# Patient Record
Sex: Female | Born: 1985
Health system: Southern US, Community
[De-identification: ages and names within clinical notes are randomized; demographics above are authoritative.]

## PROBLEM LIST (undated history)

## (undated) ENCOUNTER — Inpatient Hospital Stay (HOSPITAL_COMMUNITY): Payer: Self-pay

## (undated) DIAGNOSIS — S3992XA Unspecified injury of lower back, initial encounter: Secondary | ICD-10-CM

## (undated) DIAGNOSIS — I10 Essential (primary) hypertension: Secondary | ICD-10-CM

## (undated) DIAGNOSIS — F419 Anxiety disorder, unspecified: Secondary | ICD-10-CM

## (undated) HISTORY — DX: Essential (primary) hypertension: I10

## (undated) HISTORY — DX: Anxiety disorder, unspecified: F41.9

## (undated) HISTORY — PX: APPENDECTOMY: SHX54

---

## 2004-05-03 ENCOUNTER — Encounter: Admission: RE | Admit: 2004-05-03 | Discharge: 2004-05-03 | Payer: Self-pay | Admitting: Family Medicine

## 2006-12-17 ENCOUNTER — Inpatient Hospital Stay (HOSPITAL_COMMUNITY): Admission: AD | Admit: 2006-12-17 | Discharge: 2006-12-17 | Payer: Self-pay | Admitting: Obstetrics and Gynecology

## 2007-07-21 ENCOUNTER — Inpatient Hospital Stay (HOSPITAL_COMMUNITY): Admission: AD | Admit: 2007-07-21 | Discharge: 2007-07-21 | Payer: Self-pay | Admitting: Obstetrics and Gynecology

## 2007-07-28 ENCOUNTER — Inpatient Hospital Stay (HOSPITAL_COMMUNITY): Admission: AD | Admit: 2007-07-28 | Discharge: 2007-07-31 | Payer: Self-pay | Admitting: Obstetrics and Gynecology

## 2007-07-29 ENCOUNTER — Encounter (INDEPENDENT_AMBULATORY_CARE_PROVIDER_SITE_OTHER): Payer: Self-pay | Admitting: Obstetrics and Gynecology

## 2009-09-26 ENCOUNTER — Emergency Department (HOSPITAL_COMMUNITY): Admission: EM | Admit: 2009-09-26 | Discharge: 2009-09-26 | Payer: Self-pay | Admitting: Emergency Medicine

## 2011-03-06 NOTE — Discharge Summary (Signed)
Ashley [name], Ashley [name]             ACCOUNT NO.:  [id number]   MEDICAL RECORD NO.:  [id number]          PATIENT TYPE:  INP   LOCATION:  9118                          FACILITY:  [facility]   PHYSICIAN:  Malachi Pro. Ambrose Mantle, M.D. DATE OF BIRTH:  1986/05/07   DATE OF ADMISSION:  07/28/2007  DATE OF DISCHARGE:  07/31/2007                               DISCHARGE SUMMARY   A 25 year old white female para 0, gravida 1, EDC August 05, 2007  presented for induction of labor due to increased blood pressure.  Blood  group and type O+, negative antibody, RPR nonreactive, rubella immune,  hepatitis B surface antigen negative, HIV negative, GC and chlamydia  negative, 1-hour Glucola 114.  Group B strep positive. prenatal care  associated with occasional palpitations with recent increased blood  pressure with normal labs, normal  24-hour urine protein.  Blood  pressure on admission in the office was 140-150 over 100-110.   PAST MEDICAL HISTORY:  Negative.   SURGICAL HISTORY:  Negative.   ALLERGIES:  Questionable to OMNIPEN.   MEDICATIONS:  None.   PHYSICAL EXAMINATION:  VITAL SIGNS:  On admission revealed normal vital  signs except for heart rate 125, blood pressure 130/80, heart normal  size and sounds.  There was tachycardia.  LUNGS:  Clear to auscultation.  ABDOMEN:  Gravid.  Estimated fetal weight 7 pounds.  Fetal heart tones  normal.  Cervix 1 cm, 40% vertex at a -3.   Because of questionable PENICILLIN allergy the patient was begun on  Ancef for the positive group B strep.  After overnight use of Pitocin at  8:20 a.m. on July 29, 2007, the cervix was 3-4 cm dilated, vertex was  present, and cervix was 80% effaced.  The patient received an epidural.  By 1:19 p.m. the cervix was 9 cm, 100% vertex at a 0 station.  She  progressed to full dilatation and pushed with two contractions, and the  vertex came to the perineum.  With two more pushes the vertex rotated  ROT ROA and without pushing, the  baby delivered over a second-degree  midline laceration by Dr. Ambrose Mantle.  The infant was female, 7 pounds 10  ounces, Apgars of 9 at 1 and 9 at 5 minutes.  Placenta was intact.  A  few membranes were removed with ring forceps.  Second-degree midline  laceration repaired with 3-0 Vicryl.  Blood loss about 400 cc.  Postpartum the patient did well and was discharged on the second  postpartum day.  Her blood pressure never rose to any significant level.   LABORATORY DATA:  Showed initial white count 9200, hemoglobin 13.2,  hematocrit 38.1, platelet count 223,000.  Follow-up hemoglobins were  13.5 prior to delivery and 10.6 postpartum.  PIH labs showed normal  liver function tests.  SGOT was 21.  PT 24.  LDH 193.  Uric acid 4.5.   FINAL DIAGNOSES:  1. Intrauterine pregnancy 38+ weeks.  2. Pregnancy-induced hypertension.   OPERATIONS:  Spontaneous delivery ROA, repair of second degree midline  laceration.   FINAL CONDITION:  Improved.   INSTRUCTIONS:  Regular discharge instruction booklet.  Percocet 5/325 20  tablets one every 4-6 hours as needed for pain.  The patient is advised  to return to the office in six weeks for follow-up examination.      Malachi Pro. Ambrose Mantle, M.D.  Electronically Signed     TFH/MEDQ  D:  07/31/2007  T:  07/31/2007  Job:  [id number]

## 2011-03-10 ENCOUNTER — Emergency Department (HOSPITAL_COMMUNITY): Payer: 59

## 2011-03-10 ENCOUNTER — Other Ambulatory Visit (INDEPENDENT_AMBULATORY_CARE_PROVIDER_SITE_OTHER): Payer: Self-pay | Admitting: Surgery

## 2011-03-10 ENCOUNTER — Inpatient Hospital Stay (HOSPITAL_COMMUNITY)
Admission: EM | Admit: 2011-03-10 | Discharge: 2011-03-10 | DRG: 343 | Disposition: A | Discharge status: Home Health | Payer: 59 | Attending: General Surgery | Admitting: General Surgery

## 2011-03-10 DIAGNOSIS — K3580 Unspecified acute appendicitis: Principal | ICD-10-CM | POA: Diagnosis present

## 2011-03-10 LAB — COMPREHENSIVE METABOLIC PANEL
ALT: 33 U/L (ref 0–35)
AST: 18 U/L (ref 0–37)
Albumin: 3.9 g/dL (ref 3.5–5.2)
Chloride: 101 mEq/L (ref 96–112)
Glucose, Bld: 105 mg/dL — ABNORMAL HIGH (ref 70–99)
Potassium: 3.2 mEq/L — ABNORMAL LOW (ref 3.5–5.1)
Sodium: 135 mEq/L (ref 135–145)
Total Bilirubin: 0.4 mg/dL (ref 0.3–1.2)

## 2011-03-10 LAB — CBC
HCT: 40.5 % (ref 36.0–46.0)
Hemoglobin: 14.2 g/dL (ref 12.0–15.0)
Hemoglobin: 12.5 g/dL (ref 12.0–15.0)
MCH: 31.3 pg (ref 26.0–34.0)
MCHC: 35.2 g/dL (ref 30.0–36.0)
MCV: 89.2 fL (ref 78.0–100.0)
MCV: 89.4 fL (ref 78.0–100.0)
RDW: 11.9 % (ref 11.5–15.5)

## 2011-03-10 LAB — URINALYSIS, ROUTINE W REFLEX MICROSCOPIC
Glucose, UA: NEGATIVE mg/dL
Ketones, ur: >80 mg/dL — AB
pH: 6 (ref 5.0–8.0)

## 2011-03-10 LAB — DIFFERENTIAL
Lymphocytes Relative: 12 % (ref 12–46)
Lymphs Abs: 1.8 10*3/uL (ref 0.7–4.0)
Monocytes Absolute: 0.9 10*3/uL (ref 0.1–1.0)
Monocytes Relative: 6 % (ref 3–12)
Neutro Abs: 12 10*3/uL — ABNORMAL HIGH (ref 1.7–7.7)

## 2011-03-10 LAB — WET PREP, GENITAL
Trich, Wet Prep: NONE SEEN
Yeast Wet Prep HPF POC: NONE SEEN

## 2011-03-10 LAB — GC/CHLAMYDIA PROBE AMP, GENITAL
Chlamydia, DNA Probe: NEGATIVE
GC Probe Amp, Genital: NEGATIVE

## 2011-03-10 MED ORDER — IOHEXOL 300 MG/ML  SOLN
125.0000 mL | Freq: Once | INTRAMUSCULAR | Status: AC | PRN
  Administered 2011-03-10: 125 mL via INTRAVENOUS

## 2011-04-04 NOTE — Op Note (Signed)
  NAMEMARSI, TURVEY             ACCOUNT NO.:  [id number]  MEDICAL RECORD NO.:  [id number]           PATIENT TYPE:  E  LOCATION:  WLED                         FACILITY:  [facility]  PHYSICIAN:  Thornton Park. 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  PROCEDURE:  Laparoscopic appendectomy.  FINDINGS:  Acute appendicitis and partially extraperitoneal appendix.  DESCRIPTION OF PROCEDURE:  The patient was taken to room 1 on Saturday morning by 5 a.m., given general anesthesia.  Preoperatively she received 1 g of Invanz 1 hour preop and the abdomen was prepped with PCMX and draped sterilely.  The abdomen was accessed via umbilicus under Hassan technique.  I cut down and did this open without difficulty.  The abdomen was inflated to 50 mm and a 5 mm trocar was placed in the right upper quadrant and 5 in the left lower quadrant.  I used 5 mm eventually an angled 30-degree scope.  By viewing the CT scan, I suspected this was going to be a right extraperitoneal appendix as it seemed to deviate towards the midline and was fairly posterior.  The tip was grasped and elevated and I came along the inferior border and mobilized it back towards its base.  I then went to the mesentery deliberately with a Harmonic scalpel transecting the appendiceal artery.  Where this area was pulsating, I put some clips on this as well, although I never got into the active bleeding there.  The base was isolated, transected with the Endo-GIA with a white cartridge.  There is a little bit of bleeding from the staple line, which I cauterized with some tube burst touches with the harmonic scalpel.  I protected the terminal ileum from everything and everything looked to be in order.  There was some oozing that I noticed  after I removed the appendix and I went back and irrigated again and cauterized.  Again, no bleeding was seen at that point.  Irrigant was withdrawn.  Everything appeared be in order.  The wound sites were injected with 30 mL of 0.5% Marcaine and then the umbilical defect with [facility] wires was closed with 2 interrupted 0 Vicryls.  Wounds were closed with 4-0 Vicryl and Dermabond.  The patient tolerated the procedure well and was taken to the recovery room in satisfactory condition.     Thornton Park Daphine Deutscher, MD     MBM/MEDQ  D:  03/10/2011  T:  03/10/2011  Job:  [id number]  Electronically Signed by Luretha Murphy MD on 04/04/2011 04:30:59 PM

## 2011-04-24 NOTE — Discharge Summary (Signed)
  [name], [name] NO.:  [id number]  MEDICAL RECORD NO.:  [id number]  LOCATION:  1527                         FACILITY:  [facility]  PHYSICIAN:  Thornton Park. Daphine Deutscher, MD  DATE OF BIRTH:  Apr 30, 1986  DATE OF ADMISSION:  03/10/2011 DATE OF DISCHARGE:  03/10/2011                              DISCHARGE SUMMARY   ADMITTING DIAGNOSIS:  Acute appendicitis.  PROCEDURE:  Laparoscopic appendectomy.  COURSE IN THE HOSPITAL:  This patient was admitted and had a laparoscopic appendectomy.  She did very well and was ready for discharge on postop day #1.  Arrangements were made for return visit in the office and return to work.  Condition good.     Thornton Park Daphine Deutscher, MD     MBM/MEDQ  D:  04/04/2011  T:  04/04/2011  Job:  [id number]  Electronically Signed by Luretha Murphy MD on 04/24/2011 10:15:50 AM

## 2011-05-14 ENCOUNTER — Other Ambulatory Visit (INDEPENDENT_AMBULATORY_CARE_PROVIDER_SITE_OTHER): Payer: Self-pay | Admitting: Surgery

## 2011-05-14 ENCOUNTER — Telehealth (INDEPENDENT_AMBULATORY_CARE_PROVIDER_SITE_OTHER): Payer: Self-pay | Admitting: General Surgery

## 2011-05-14 ENCOUNTER — Ambulatory Visit (HOSPITAL_COMMUNITY)
Admission: RE | Admit: 2011-05-14 | Discharge: 2011-05-14 | Disposition: A | Discharge status: Home / Self Care | Payer: 59 | Source: Ambulatory Visit | Attending: Surgery | Admitting: Surgery

## 2011-05-14 DIAGNOSIS — R1011 Right upper quadrant pain: Secondary | ICD-10-CM

## 2011-05-14 NOTE — Progress Notes (Signed)
Ashley [name] called this [phone]) with history of nocturnal postprandial right upper quadrant pain.  She recently had a lap appendectomy by me.  Have ordered ultrasound to look for gallstones.

## 2011-05-17 ENCOUNTER — Other Ambulatory Visit (HOSPITAL_COMMUNITY): Payer: 59

## 2011-08-02 LAB — CBC
HCT: 38.1
HCT: 39.5
HCT: 36.8
Hemoglobin: 10.6 — ABNORMAL LOW
Hemoglobin: 12.9
MCHC: 34.3
MCHC: 35.2
MCV: 91.3
MCV: 93
MCV: 91.5
Platelets: 217
Platelets: 223
RBC: 3.34 — ABNORMAL LOW
RBC: 4.17
RDW: 13
RDW: 13.3
WBC: 9.2

## 2011-08-02 LAB — COMPREHENSIVE METABOLIC PANEL
ALT: 24
Albumin: 2.8 — ABNORMAL LOW
Alkaline Phosphatase: 248 — ABNORMAL HIGH
BUN: 9
CO2: 19
Calcium: 9.1
Creatinine, Ser: 0.68
GFR calc Af Amer: >60
Glucose, Bld: 61 — ABNORMAL LOW
Glucose, Bld: 114 — ABNORMAL HIGH
Potassium: 3.9
Sodium: 135
Total Bilirubin: 0.4
Total Protein: 6
Total Protein: 5.9 — ABNORMAL LOW

## 2011-08-02 LAB — URINALYSIS, ROUTINE W REFLEX MICROSCOPIC
Nitrite: NEGATIVE
Protein, ur: NEGATIVE
Specific Gravity, Urine: >1.03 — ABNORMAL HIGH
Urobilinogen, UA: 0.2

## 2011-08-02 LAB — LACTATE DEHYDROGENASE: LDH: 180

## 2011-08-02 LAB — URIC ACID: Uric Acid, Serum: 4.4

## 2011-08-02 LAB — URINE MICROSCOPIC-ADD ON

## 2012-06-16 ENCOUNTER — Encounter (HOSPITAL_COMMUNITY): Payer: Self-pay | Admitting: *Deleted

## 2012-06-16 ENCOUNTER — Emergency Department (INDEPENDENT_AMBULATORY_CARE_PROVIDER_SITE_OTHER): Payer: 59

## 2012-06-16 ENCOUNTER — Emergency Department (HOSPITAL_COMMUNITY)
Admission: EM | Admit: 2012-06-16 | Discharge: 2012-06-16 | Disposition: A | Discharge status: Home / Self Care | Payer: 59 | Source: Home / Self Care | Attending: Emergency Medicine | Admitting: Emergency Medicine

## 2012-06-16 DIAGNOSIS — S93409A Sprain of unspecified ligament of unspecified ankle, initial encounter: Secondary | ICD-10-CM

## 2012-06-16 MED ORDER — HYDROMORPHONE HCL PF 1 MG/ML IJ SOLN
2.0000 mg | Freq: Once | INTRAMUSCULAR | Status: AC
  Administered 2012-06-16: 2 mg via INTRAMUSCULAR

## 2012-06-16 MED ORDER — ONDANSETRON HCL 4 MG/2ML IJ SOLN
4.0000 mg | Freq: Once | INTRAMUSCULAR | Status: AC
  Administered 2012-06-16: 4 mg via INTRAMUSCULAR

## 2012-06-16 MED ORDER — OXYCODONE-ACETAMINOPHEN 5-325 MG PO TABS: ORAL_TABLET | ORAL | Status: AC

## 2012-06-16 MED ORDER — ONDANSETRON HCL 4 MG/2ML IJ SOLN
INTRAMUSCULAR | Status: AC
  Filled 2012-06-16: qty 2

## 2012-06-16 MED ORDER — HYDROMORPHONE HCL PF 1 MG/ML IJ SOLN
INTRAMUSCULAR | Status: AC
  Filled 2012-06-16: qty 2

## 2012-06-16 NOTE — ED Provider Notes (Signed)
Chief Complaint  Patient presents with  . 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 She was out jogging this morning when she stepped on uneven portion of pavement and prior to her left ankle. She pop and since then she's had swelling and pain over the lateral malleolus and been unable to bear weight. She denies any numbness or tingling.  Review of Systems:  Other than noted above, the patient denies any of the following symptoms: Systemic:  No fevers, chills, sweats, or aches.  No fatigue or tiredness. Musculoskeletal:  No joint pain, arthritis, bursitis, swelling, back pain, or neck pain. Neurological:  No muscular weakness, paresthesias, headache, or trouble with speech or coordination.  No dizziness.   PMFSH:  Past medical history, family history, social history, meds, and allergies were reviewed.  Physical Exam:   Vital signs:  BP 121/83  Pulse 106  Temp 97.4 F (36.3 C) (Oral)  Resp 18  SpO2 100%  LMP 06/02/2012 Gen:  Alert and oriented times 3.  In no distress. Musculoskeletal: There is marketed swelling over the lateral malleolus. No bruising. It's extremely tender to palpation. She has very little range of motion of the ankle. Unable to do talar tilt, but drawer sign is positive. Also the upper leg squeeze test was positive. Otherwise, all joints had a full a ROM with no swelling, bruising or deformity.  No edema, pulses full. Extremities were warm and pink.  Capillary refill was brisk.  Skin:  Clear, warm and dry.  No rash. Neuro:  Alert and oriented times 3.  Muscle strength was normal.  Sensation was intact to light touch.   Radiology:  Dg Ankle Complete Left  06/16/2012  *RADIOLOGY REPORT*  Clinical Data: Rolled left ankle while running, lateral pain and soft tissue swelling, limited range of motion  LEFT ANKLE COMPLETE - 3+ VIEW  Comparison: None  Findings: Lateral soft tissue  swelling. Ankle mortise intact. Osseous mineralization normal. Plantar calcaneal spur. No acute fracture, dislocation or bone destruction.  IMPRESSION: No acute osseous abnormalities.   Original Report Authenticated By: Lollie Marrow, M.D.    I reviewed the images independently and personally and concur with the radiologist's findings.  Course in Urgent Care Center:   For the pain she was given Dilaudid 2 mg IM and Zofran 4 mg IM. She tolerated this well without any immediate side effects and with good control of her pain. She was placed in a short leg stirrup splint and given crutches for ambulation.  Assessment:  The encounter diagnosis was Ankle sprain. This is probably a third-degree sprain. She may also have a tibiofibular ligament sprain as well.  Plan:   1.  The following meds were prescribed:   New Prescriptions   OXYCODONE-ACETAMINOPHEN (PERCOCET) 5-325 MG PER TABLET    1 to 2 tablets every 6 hours as needed for pain.   2.  The patient was instructed in symptomatic care, including rest and activity, elevation, application of ice and compression.  Appropriate handouts were given. 3.  The patient was told to return if becoming worse in any way, if no better in 3 or 4 days, and given some red flag symptoms that would indicate earlier return.   4.  The patient was told to follow up with Dr. Leonides Grills later on this week.   Reuben Likes, MD 06/16/12 [phone]

## 2012-06-16 NOTE — Progress Notes (Signed)
Orthopedic Tech Progress Note Patient Details:  Ashley [name] 1986/06/03 [id number] Short leg stirrup splint applied to Left lower extremity; crutches/instruction given Ortho Devices Type of Ortho Device: Stirrup splint;Short leg splint;Crutches Ortho Device/Splint Location: Applied to Left LE Ortho Device/Splint Interventions: Application   Asia R Thompson 06/16/2012, 2:47 PM

## 2012-06-16 NOTE — ED Notes (Signed)
Pt  Reports  She  Felled  About  1  Hour  Ago     She  inj        He  l  Ankle  And         And  r       Knee     Abrasion to  r knee  And  swelling  Deformity  Of l  Ankle        Pt        Is  Allergic  To  Sulfa       Ice  And    Pillow   Applied              Did     Not  Black out   Does  However     Appear  In  Pain

## 2013-03-22 DIAGNOSIS — S3992XA Unspecified injury of lower back, initial encounter: Secondary | ICD-10-CM

## 2013-03-22 HISTORY — DX: Unspecified injury of lower back, initial encounter: S39.92XA

## 2013-05-08 ENCOUNTER — Other Ambulatory Visit: Payer: Self-pay | Admitting: Occupational Medicine

## 2013-05-08 ENCOUNTER — Ambulatory Visit: Payer: Self-pay

## 2013-05-08 DIAGNOSIS — R52 Pain, unspecified: Secondary | ICD-10-CM

## 2013-05-13 ENCOUNTER — Ambulatory Visit: Payer: PRIVATE HEALTH INSURANCE | Attending: Family Medicine | Admitting: Physical Therapy

## 2013-05-13 ENCOUNTER — Ambulatory Visit: Payer: PRIVATE HEALTH INSURANCE | Admitting: Physical Therapy

## 2013-05-13 DIAGNOSIS — IMO0001 Reserved for inherently not codable concepts without codable children: Secondary | ICD-10-CM | POA: Insufficient documentation

## 2013-05-13 DIAGNOSIS — M545 Low back pain, unspecified: Secondary | ICD-10-CM | POA: Insufficient documentation

## 2013-05-26 ENCOUNTER — Ambulatory Visit: Payer: PRIVATE HEALTH INSURANCE | Attending: Surgery | Admitting: Physical Therapy

## 2013-05-26 DIAGNOSIS — IMO0001 Reserved for inherently not codable concepts without codable children: Secondary | ICD-10-CM | POA: Insufficient documentation

## 2013-05-26 DIAGNOSIS — M545 Low back pain, unspecified: Secondary | ICD-10-CM | POA: Insufficient documentation

## 2013-05-28 ENCOUNTER — Ambulatory Visit: Payer: PRIVATE HEALTH INSURANCE | Admitting: Physical Therapy

## 2013-06-01 ENCOUNTER — Ambulatory Visit: Payer: PRIVATE HEALTH INSURANCE | Admitting: Physical Therapy

## 2013-06-02 ENCOUNTER — Other Ambulatory Visit (HOSPITAL_COMMUNITY): Payer: Self-pay | Admitting: Physical Medicine and Rehabilitation

## 2013-06-02 DIAGNOSIS — M79604 Pain in right leg: Secondary | ICD-10-CM

## 2013-06-04 ENCOUNTER — Ambulatory Visit: Payer: Self-pay | Admitting: Physical Therapy

## 2013-06-08 ENCOUNTER — Ambulatory Visit (HOSPITAL_COMMUNITY)
Admission: RE | Admit: 2013-06-08 | Discharge: 2013-06-08 | Disposition: A | Discharge status: Home / Self Care | Payer: PRIVATE HEALTH INSURANCE | Source: Ambulatory Visit | Attending: Physical Medicine and Rehabilitation | Admitting: Physical Medicine and Rehabilitation

## 2013-06-08 ENCOUNTER — Ambulatory Visit (HOSPITAL_COMMUNITY): Payer: Self-pay

## 2013-06-08 DIAGNOSIS — Q7649 Other congenital malformations of spine, not associated with scoliosis: Secondary | ICD-10-CM | POA: Insufficient documentation

## 2013-06-08 DIAGNOSIS — M79604 Pain in right leg: Secondary | ICD-10-CM

## 2013-06-08 DIAGNOSIS — M5124 Other intervertebral disc displacement, thoracic region: Secondary | ICD-10-CM | POA: Insufficient documentation

## 2013-06-08 DIAGNOSIS — M129 Arthropathy, unspecified: Secondary | ICD-10-CM | POA: Insufficient documentation

## 2013-06-09 ENCOUNTER — Ambulatory Visit: Payer: PRIVATE HEALTH INSURANCE | Admitting: Physical Therapy

## 2013-06-17 ENCOUNTER — Ambulatory Visit: Payer: PRIVATE HEALTH INSURANCE | Admitting: Physical Therapy

## 2013-06-29 ENCOUNTER — Ambulatory Visit: Payer: PRIVATE HEALTH INSURANCE | Attending: Family Medicine | Admitting: Physical Therapy

## 2013-06-29 DIAGNOSIS — IMO0001 Reserved for inherently not codable concepts without codable children: Secondary | ICD-10-CM | POA: Insufficient documentation

## 2013-06-29 DIAGNOSIS — M545 Low back pain, unspecified: Secondary | ICD-10-CM | POA: Insufficient documentation

## 2013-07-02 ENCOUNTER — Ambulatory Visit: Payer: PRIVATE HEALTH INSURANCE | Admitting: Physical Therapy

## 2013-12-30 ENCOUNTER — Encounter (HOSPITAL_COMMUNITY): Payer: Self-pay | Admitting: Emergency Medicine

## 2013-12-30 ENCOUNTER — Emergency Department (HOSPITAL_COMMUNITY)
Admission: EM | Admit: 2013-12-30 | Discharge: 2013-12-30 | Disposition: A | Discharge status: Home / Self Care | Payer: 59 | Source: Home / Self Care | Attending: Family Medicine | Admitting: Family Medicine

## 2013-12-30 DIAGNOSIS — J039 Acute tonsillitis, unspecified: Secondary | ICD-10-CM

## 2013-12-30 LAB — POCT RAPID STREP A: STREPTOCOCCUS, GROUP A SCREEN (DIRECT): NEGATIVE

## 2013-12-30 MED ORDER — CEFTRIAXONE SODIUM 1 G IJ SOLR
1.0000 g | Freq: Once | INTRAMUSCULAR | Status: AC
  Administered 2013-12-30: 1 g via INTRAMUSCULAR

## 2013-12-30 MED ORDER — CEFTRIAXONE SODIUM 1 G IJ SOLR
INTRAMUSCULAR | Status: AC
  Filled 2013-12-30: qty 10

## 2013-12-30 MED ORDER — AMOXICILLIN-POT CLAVULANATE 875-125 MG PO TABS: 1.0000 | ORAL_TABLET | Freq: Two times a day (BID) | ORAL | Status: DC

## 2013-12-30 MED ORDER — METHYLPREDNISOLONE ACETATE 40 MG/ML IJ SUSP
40.0000 mg | Freq: Once | INTRAMUSCULAR | Status: AC
  Administered 2013-12-30: 40 mg via INTRAMUSCULAR

## 2013-12-30 NOTE — ED Provider Notes (Addendum)
Ashley [name] is a 28 y.o. female who presents to Urgent Care today for sore throat. Patient has one day of sore throat with mild fevers and chills and pain with swallowing. No trouble breathing nausea vomiting or diarrhea. Patient has tried over-the-counter combination cold medications this morning which was not very effective. She typically has a mildly elevated heart rate. No chest pains or palpitations. She feels well otherwise. 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    No history of recent immobility leg swelling chest pains or palpitations.  History reviewed. No pertinent past medical history. History  Substance Use Topics  . Smoking status: Never Smoker   . Smokeless tobacco: Not on file  . Alcohol Use: No   ROS as above Medications: No current facility-administered medications for this encounter.   Current Outpatient Prescriptions  Medication Sig Dispense Refill  . amoxicillin-clavulanate (AUGMENTIN) 875-125 MG per tablet Take 1 tablet by mouth every 12 (twelve) hours.  14 tablet  0    Exam:  BP 141/99  Pulse 120  Temp(Src) 98.8 F (37.1 C) (Oral)  Resp 16  SpO2 100%  LMP 12/16/2013 Gen: Well NAD HEENT: EOMI,  MMM Posterior pharynx: Right tonsil is enlarged with exudate. Left is normal size with erythema and exudate. Tympanic membranes are normal appearing bilaterally Lungs: Normal work of breathing. CTABL Heart:  tachycardia but regular no MRG Abd: NABS, Soft. NT, ND Exts: Brisk capillary refill, warm and well perfused. No leg swelling  Results for orders placed during the hospital encounter of 12/30/13 (from the past 24 hour(s))  POCT RAPID STREP A ([place])     Status: None   Collection Time    12/30/13  9:03 AM      Result Value Ref Range   Streptococcus, Group A Screen (Direct) NEGATIVE  NEGATIVE   No results found.  Assessment and Plan: 28 y.o. female with tonsillitis. Plan to treat with IM ceftriaxone and Depo-Medrol and oral Augmentin.  Followup with primary care provider as needed. Work note provided.  Tachycardia is normal for this patient however I think is exacerbated due to recent use of combination cold medication. Followup as needed  Discussed warning signs or symptoms. Please see discharge instructions. Patient expresses understanding.    Gregor Hams, MD 12/30/13 [id number]  Gregor Hams, MD 12/30/13 [phone]

## 2013-12-30 NOTE — Discharge Instructions (Signed)
Thank you for coming in today. I think you have tonsillitis.  Start Augmentin twice daily tomorrow.  Use up to 2 aleve twice daily for pain as needed starting tonight.  Return to work when feeling better.  Avoid "cold medicine". The sudafed in that medicine is increasing your heart rate.  Follow up with your primary doctor.   Call or go to the emergency room if you get worse, have trouble breathing, have chest pains, or palpitations.   Tonsillitis Tonsillitis is an infection of the throat that causes the tonsils to become red, tender, and swollen. Tonsils are collections of lymphoid tissue at the back of the throat. Each tonsil has crevices (crypts). Tonsils help fight nose and throat infections and keep infection from spreading to other parts of the body for the first 18 months of life.  CAUSES Sudden (acute) tonsillitis is usually caused by infection with streptococcal bacteria. Long-lasting (chronic) tonsillitis occurs when the crypts of the tonsils become filled with pieces of food and bacteria, which makes it easy for the tonsils to become repeatedly infected. SYMPTOMS  Symptoms of tonsillitis include:  A sore throat, with possible difficulty swallowing.  White patches on the tonsils.  Fever.  Tiredness.  New episodes of snoring during sleep, when you did not snore before.  Small, foul-smelling, yellowish-white pieces of material (tonsilloliths) that you occasionally cough up or spit out. The tonsilloliths can also cause you to have bad breath. DIAGNOSIS Tonsillitis can be diagnosed through a physical exam. Diagnosis can be confirmed with the results of lab tests, including a throat culture. TREATMENT  The goals of tonsillitis treatment include the reduction of the severity and duration of symptoms and prevention of associated conditions. Symptoms of tonsillitis can be improved with the use of steroids to reduce the swelling. Tonsillitis caused by bacteria can be treated with  antibiotics. Usually, treatment with antibiotics is started before the cause of the tonsillitis is known. However, if it is determined that the cause is not bacterial, antibiotics will not treat the tonsillitis. If attacks of tonsillitis are severe and frequent, your caregiver may recommend surgery to remove the tonsils (tonsillectomy). HOME CARE INSTRUCTIONS   Rest as much as possible and get plenty of sleep.  Drink plenty of fluids. While the throat is very sore, eat soft foods or liquids, such as sherbet, soups, or instant breakfast drinks.  Eat frozen ice pops.  Gargle with a warm or cold liquid to help soothe the throat. Mix 1/4 teaspoon of salt and 1/4 teaspoon of baking soda in in 8 oz of water. SEEK MEDICAL CARE IF:   Large, tender lumps develop in your neck.  A rash develops.  A green, yellow-brown, or bloody substance is coughed up.  You are unable to swallow liquids or food for 24 hours.  You notice that only one of the tonsils is swollen. SEEK IMMEDIATE MEDICAL CARE IF:   You develop any new symptoms such as vomiting, severe headache, stiff neck, chest pain, or trouble breathing or swallowing.  You have severe throat pain along with drooling or voice changes.  You have severe pain, unrelieved with recommended medications.  You are unable to fully open the mouth.  You develop redness, swelling, or severe pain anywhere in the neck.  You have a fever. MAKE SURE YOU:   Understand these instructions.  Will watch your condition.  Will get help right away if you are not doing well or get worse. Document Released: 07/18/2005 Document Revised: 06/10/2013 Document Reviewed: 03/27/2013  ExitCare Patient Information 2014 [place].

## 2013-12-30 NOTE — ED Notes (Signed)
Pt  Reports  Symptoms  Of  sorethroat  /  Body    Aches  And  Chills                And  r  Ear  Pain       Hurts  To  Swallow         Symptoms     X  2  Days

## 2014-01-01 LAB — CULTURE, GROUP A STREP

## 2014-03-23 ENCOUNTER — Other Ambulatory Visit (HOSPITAL_COMMUNITY): Payer: Self-pay | Admitting: Physician Assistant

## 2014-03-23 ENCOUNTER — Encounter (HOSPITAL_COMMUNITY): Payer: Self-pay

## 2014-03-23 ENCOUNTER — Ambulatory Visit (HOSPITAL_COMMUNITY)
Admission: RE | Admit: 2014-03-23 | Discharge: 2014-03-23 | Disposition: A | Discharge status: Home / Self Care | Payer: PRIVATE HEALTH INSURANCE | Source: Ambulatory Visit | Attending: Physician Assistant | Admitting: Physician Assistant

## 2014-03-23 DIAGNOSIS — R51 Headache: Secondary | ICD-10-CM | POA: Insufficient documentation

## 2014-03-23 DIAGNOSIS — R519 Headache, unspecified: Secondary | ICD-10-CM

## 2014-03-23 DIAGNOSIS — M542 Cervicalgia: Secondary | ICD-10-CM | POA: Diagnosis not present

## 2014-09-30 ENCOUNTER — Other Ambulatory Visit (INDEPENDENT_AMBULATORY_CARE_PROVIDER_SITE_OTHER): Payer: Self-pay | Admitting: General Surgery

## 2014-09-30 DIAGNOSIS — J039 Acute tonsillitis, unspecified: Secondary | ICD-10-CM

## 2014-09-30 MED ORDER — AMOXICILLIN 875 MG PO TABS: 875.0000 mg | ORAL_TABLET | Freq: Two times a day (BID) | ORAL | Status: DC

## 2014-09-30 NOTE — Progress Notes (Unsigned)
Pt without fevers, but with severe right throat pain and enlarged tonsil.  History of recurrent tonsillitis.  Amoxicillin given.

## 2015-05-09 LAB — OB RESULTS CONSOLE GC/CHLAMYDIA
Chlamydia: NEGATIVE
GC PROBE AMP, GENITAL: NEGATIVE

## 2015-05-09 LAB — OB RESULTS CONSOLE ABO/RH: RH Type: POSITIVE

## 2015-05-09 LAB — OB RESULTS CONSOLE RUBELLA ANTIBODY, IGM: Rubella: IMMUNE

## 2015-05-09 LAB — OB RESULTS CONSOLE HEPATITIS B SURFACE ANTIGEN: Hepatitis B Surface Ag: NEGATIVE

## 2015-05-09 LAB — OB RESULTS CONSOLE HIV ANTIBODY (ROUTINE TESTING): HIV: NONREACTIVE

## 2015-05-09 LAB — OB RESULTS CONSOLE ANTIBODY SCREEN: Antibody Screen: NEGATIVE

## 2015-05-09 LAB — OB RESULTS CONSOLE RPR: RPR: NONREACTIVE

## 2015-09-09 ENCOUNTER — Inpatient Hospital Stay (HOSPITAL_COMMUNITY)
Admission: AD | Admit: 2015-09-09 | Discharge: 2015-09-09 | Disposition: A | Discharge status: Home / Self Care | Payer: 59 | Source: Ambulatory Visit | Attending: Obstetrics and Gynecology | Admitting: Obstetrics and Gynecology

## 2015-09-09 ENCOUNTER — Encounter (HOSPITAL_COMMUNITY): Payer: Self-pay | Admitting: *Deleted

## 2015-09-09 DIAGNOSIS — Z3A28 28 weeks gestation of pregnancy: Secondary | ICD-10-CM | POA: Insufficient documentation

## 2015-09-09 DIAGNOSIS — R030 Elevated blood-pressure reading, without diagnosis of hypertension: Secondary | ICD-10-CM | POA: Diagnosis present

## 2015-09-09 DIAGNOSIS — O133 Gestational [pregnancy-induced] hypertension without significant proteinuria, third trimester: Secondary | ICD-10-CM | POA: Diagnosis not present

## 2015-09-09 HISTORY — DX: Unspecified injury of lower back, initial encounter: S39.92XA

## 2015-09-09 LAB — COMPREHENSIVE METABOLIC PANEL
ALT: 38 U/L (ref 14–54)
ANION GAP: 8 (ref 5–15)
AST: 25 U/L (ref 15–41)
Albumin: 3 g/dL — ABNORMAL LOW (ref 3.5–5.0)
Alkaline Phosphatase: 95 U/L (ref 38–126)
BILIRUBIN TOTAL: 0.5 mg/dL (ref 0.3–1.2)
BUN: 11 mg/dL (ref 6–20)
CO2: 23 mmol/L (ref 22–32)
Calcium: 8.9 mg/dL (ref 8.9–10.3)
Chloride: 105 mmol/L (ref 101–111)
Creatinine, Ser: 0.55 mg/dL (ref 0.44–1.00)
GFR calc Af Amer: >60 mL/min (ref >60)
Glucose, Bld: 83 mg/dL (ref 65–99)
POTASSIUM: 3.8 mmol/L (ref 3.5–5.1)
Sodium: 136 mmol/L (ref 135–145)
TOTAL PROTEIN: 6.6 g/dL (ref 6.5–8.1)

## 2015-09-09 LAB — CBC
HEMATOCRIT: 36.5 % (ref 36.0–46.0)
Hemoglobin: 12.5 g/dL (ref 12.0–15.0)
MCH: 30.9 pg (ref 26.0–34.0)
MCHC: 34.2 g/dL (ref 30.0–36.0)
MCV: 90.3 fL (ref 78.0–100.0)
Platelets: 244 10*3/uL (ref 150–400)
RBC: 4.04 MIL/uL (ref 3.87–5.11)
RDW: 12.8 % (ref 11.5–15.5)
WBC: 8.9 10*3/uL (ref 4.0–10.5)

## 2015-09-09 LAB — LACTATE DEHYDROGENASE: LDH: 135 U/L (ref 98–192)

## 2015-09-09 LAB — URIC ACID: Uric Acid, Serum: 3.3 mg/dL (ref 2.3–6.6)

## 2015-09-09 LAB — PROTEIN / CREATININE RATIO, URINE
CREATININE, URINE: 176 mg/dL
Protein Creatinine Ratio: 0.09 mg/mg{Cre} (ref 0.00–0.15)
Total Protein, Urine: 15 mg/dL

## 2015-09-09 NOTE — Discharge Instructions (Signed)

## 2015-09-09 NOTE — MAU Note (Addendum)
Been having BP issues off and on. Today around noon, started having blurred vision, spots, bad HA, and increased swelling.  BP  Was 150-160/90's.  Went to office 149/100, sent here for further eval. Labs 2 wks ago were normal.

## 2015-09-09 NOTE — Progress Notes (Signed)
Pt NST Category 1 PIH labs so far WNL, awaiting protein creatinine ratio BP stable 130's/90's

## 2015-09-09 NOTE — MAU Provider Note (Signed)
History     CSN: [id number]  Arrival date and time: 09/09/15 1713   First Provider Initiated Contact with Patient 09/09/15 1812          Chief Complaint  Patient presents with  . Hypertension   HPI STOREE VETTEL is a 29 y.o. G2P1001 at [redacted]w[redacted]d who presents sent from office for blood pressure evaluation.  Reports history of PIH with previous pregnancy. BP was elevated at work today, so went to office where it was still elevated.  Reports headache; states this has been ongoing throughout the pregnancy. Has not treated today's headache.  Denies vision changes, epigastric pain, chest pain, or SOB.  Denies contractions, LOF, or vaginal bleeding.  Positive fetal movement.   OB History    Gravida Para Term Preterm AB TAB SAB Ectopic Multiple Living   2 1 1       1       Past Medical History  Diagnosis Date  . Back injury June 2014    work related    Past Surgical History  Procedure Laterality Date  . Appendectomy      No family history on file.  Social History  Substance Use Topics  . Smoking status: Never Smoker   . Smokeless tobacco: None  . Alcohol Use: No    Allergies:  Allergies  Allergen Reactions  . Sulfur     Prescriptions prior to admission  Medication Sig Dispense Refill Last Dose  . Prenatal Vit-Fe Fumarate-FA (PRENATAL MULTIVITAMIN) TABS tablet Take 1 tablet by mouth daily at 12 noon.   09/09/2015 at Unknown time  . ranitidine (ZANTAC) 150 MG tablet Take 150 mg by mouth 2 (two) times daily.   09/09/2015 at Unknown time  . traMADol (ULTRAM) 50 MG tablet Take 25-50 mg by mouth daily.   09/09/2015 at Unknown time  . amoxicillin (AMOXIL) 875 MG tablet Take 1 tablet (875 mg total) by mouth 2 (two) times daily. (Patient not taking: Reported on 09/09/2015) 14 tablet 0   . amoxicillin-clavulanate (AUGMENTIN) 875-125 MG per tablet Take 1 tablet by mouth every 12 (twelve) hours. (Patient not taking: Reported on 09/09/2015) 14 tablet 0     Review of Systems   Constitutional: Negative.   Eyes: Negative for blurred vision.  Respiratory: Negative.   Cardiovascular: Negative.   Gastrointestinal: Negative.   Genitourinary: Negative.   Neurological: Positive for headaches.   Physical Exam   Blood pressure 132/95, pulse 124, temperature 98.3 F (36.8 C), temperature source Oral, resp. rate 18.   Patient Vitals for the past 24 hrs:  BP Temp Temp src Pulse Resp  09/09/15 1845 117/63 mmHg - - 102 -  09/09/15 1830 136/75 mmHg - - 105 -  09/09/15 1815 135/90 mmHg - - 113 -  09/09/15 1800 126/88 mmHg - - 112 -  09/09/15 1756 132/95 mmHg - - (!) 124 -  09/09/15 1739 130/86 mmHg 98.3 F (36.8 C) Oral 109 18     Physical Exam  Nursing note and vitals reviewed. Constitutional: She is oriented to person, place, and time. She appears well-developed and well-nourished. No distress.  HENT:  Head: Normocephalic and atraumatic.  Eyes: Conjunctivae are normal. Right eye exhibits no discharge. Left eye exhibits no discharge. No scleral icterus.  Neck: Normal range of motion.  Cardiovascular: Normal rate, regular rhythm and normal heart sounds.   No murmur heard. Respiratory: Effort normal and breath sounds normal. No respiratory distress. She has no wheezes.  Musculoskeletal: Normal range of motion. She exhibits  edema.  Neurological: She is alert and oriented to person, place, and time. She has normal reflexes.  No clonus  Skin: Skin is warm and dry. She is not diaphoretic.  Psychiatric: She has a normal mood and affect. Her behavior is normal. Judgment and thought content normal.   Fetal Tracing:  Baseline: 135 Variability: moderate Accelerations: present Decelerations: none  Toco: none    MAU Course  Procedures Results for orders placed or performed during the hospital encounter of 09/09/15 (from the past 24 hour(s))  CBC     Status: None   Collection Time: 09/09/15  5:23 PM  Result Value Ref Range   WBC 8.9 4.0 - 10.5 K/uL   RBC  4.04 3.87 - 5.11 MIL/uL   Hemoglobin 12.5 12.0 - 15.0 g/dL   HCT 36.5 36.0 - 46.0 %   MCV 90.3 78.0 - 100.0 fL   MCH 30.9 26.0 - 34.0 pg   MCHC 34.2 30.0 - 36.0 g/dL   RDW 12.8 11.5 - 15.5 %   Platelets 244 150 - 400 K/uL  Comprehensive metabolic panel     Status: Abnormal   Collection Time: 09/09/15  5:23 PM  Result Value Ref Range   Sodium 136 135 - 145 mmol/L   Potassium 3.8 3.5 - 5.1 mmol/L   Chloride 105 101 - 111 mmol/L   CO2 23 22 - 32 mmol/L   Glucose, Bld 83 65 - 99 mg/dL   BUN 11 6 - 20 mg/dL   Creatinine, Ser 0.55 0.44 - 1.00 mg/dL   Calcium 8.9 8.9 - 10.3 mg/dL   Total Protein 6.6 6.5 - 8.1 g/dL   Albumin 3.0 (L) 3.5 - 5.0 g/dL   AST 25 15 - 41 U/L   ALT 38 14 - 54 U/L   Alkaline Phosphatase 95 38 - 126 U/L   Total Bilirubin 0.5 0.3 - 1.2 mg/dL   GFR calc non Af Amer >60 >60 mL/min   GFR calc Af Amer >60 >60 mL/min   Anion gap 8 5 - 15  Lactate dehydrogenase     Status: None   Collection Time: 09/09/15  5:23 PM  Result Value Ref Range   LDH 135 98 - 192 U/L  Uric acid     Status: None   Collection Time: 09/09/15  5:23 PM  Result Value Ref Range   Uric Acid, Serum 3.3 2.3 - 6.6 mg/dL  Protein / creatinine ratio, urine     Status: None   Collection Time: 09/09/15  5:45 PM  Result Value Ref Range   Creatinine, Urine 176.00 mg/dL   Total Protein, Urine 15 mg/dL   Protein Creatinine Ratio 0.09 0.00 - 0.15 mg/mg[Cre]    MDM Category 1 tracing No severe range BPs S/w Dr. Marvel Plan, ok to discharge patient; has f/u appt in office on Tuesday.  Pt declines medication for headache  Assessment and Plan  A:  1. Gestational hypertension w/o significant proteinuria in 3rd trimester    P: Discharge home Discussed reasons to return to MAU Keep f/u appt in office on Tuesday  Jorje Guild, NP  09/09/2015, 6:12 PM

## 2015-10-23 NOTE — L&D Delivery Note (Signed)
Delivery Note Pt progressed into active labor, reached complete and pushed well.  At 10:29 PM a viable female was delivered via Vaginal, Spontaneous Delivery (Presentation: Left Occiput Anterior).  APGAR: 8, 9; weight pending.   Placenta status: , Spontaneous.  Cord: 3 vessels with the following complications: None.  Anesthesia: Epidural  Episiotomy: None Lacerations: 1st degree Suture Repair: 3.0 vicryl rapide Est. Blood Loss (mL): 200  Mom to postpartum.  Baby to Couplet care / Skin to Skin.  Discussed circumcision.  Naomi Castrogiovanni D 11/15/2015, 10:53 PM

## 2015-10-25 DIAGNOSIS — O10013 Pre-existing essential hypertension complicating pregnancy, third trimester: Secondary | ICD-10-CM | POA: Diagnosis not present

## 2015-10-25 DIAGNOSIS — Z3A34 34 weeks gestation of pregnancy: Secondary | ICD-10-CM | POA: Diagnosis not present

## 2015-10-28 DIAGNOSIS — Z3A35 35 weeks gestation of pregnancy: Secondary | ICD-10-CM | POA: Diagnosis not present

## 2015-10-28 DIAGNOSIS — Z36 Encounter for antenatal screening of mother: Secondary | ICD-10-CM | POA: Diagnosis not present

## 2015-10-28 DIAGNOSIS — O133 Gestational [pregnancy-induced] hypertension without significant proteinuria, third trimester: Secondary | ICD-10-CM | POA: Diagnosis not present

## 2015-10-28 MED FILL — LABETALOL HCL 100 MG TABLET: 100 | 30 days supply | Qty: 60 | Fill #0

## 2015-11-01 DIAGNOSIS — O133 Gestational [pregnancy-induced] hypertension without significant proteinuria, third trimester: Secondary | ICD-10-CM | POA: Diagnosis not present

## 2015-11-01 DIAGNOSIS — Z3A35 35 weeks gestation of pregnancy: Secondary | ICD-10-CM | POA: Diagnosis not present

## 2015-11-03 DIAGNOSIS — O133 Gestational [pregnancy-induced] hypertension without significant proteinuria, third trimester: Secondary | ICD-10-CM | POA: Diagnosis not present

## 2015-11-03 DIAGNOSIS — Z3A35 35 weeks gestation of pregnancy: Secondary | ICD-10-CM | POA: Diagnosis not present

## 2015-11-07 DIAGNOSIS — Z3A36 36 weeks gestation of pregnancy: Secondary | ICD-10-CM | POA: Diagnosis not present

## 2015-11-07 DIAGNOSIS — O133 Gestational [pregnancy-induced] hypertension without significant proteinuria, third trimester: Secondary | ICD-10-CM | POA: Diagnosis not present

## 2015-11-09 ENCOUNTER — Encounter (HOSPITAL_COMMUNITY): Payer: Self-pay | Admitting: *Deleted

## 2015-11-09 ENCOUNTER — Telehealth (HOSPITAL_COMMUNITY): Payer: Self-pay | Admitting: *Deleted

## 2015-11-09 LAB — OB RESULTS CONSOLE GBS: STREP GROUP B AG: POSITIVE

## 2015-11-09 NOTE — Telephone Encounter (Signed)
Preadmission screen  

## 2015-11-10 ENCOUNTER — Encounter (HOSPITAL_COMMUNITY): Payer: Self-pay | Admitting: *Deleted

## 2015-11-10 ENCOUNTER — Telehealth (HOSPITAL_COMMUNITY): Payer: Self-pay | Admitting: *Deleted

## 2015-11-10 DIAGNOSIS — Z3A36 36 weeks gestation of pregnancy: Secondary | ICD-10-CM | POA: Diagnosis not present

## 2015-11-10 DIAGNOSIS — O139 Gestational [pregnancy-induced] hypertension without significant proteinuria, unspecified trimester: Secondary | ICD-10-CM | POA: Diagnosis not present

## 2015-11-10 DIAGNOSIS — O133 Gestational [pregnancy-induced] hypertension without significant proteinuria, third trimester: Secondary | ICD-10-CM | POA: Diagnosis not present

## 2015-11-10 NOTE — Telephone Encounter (Signed)
Preadmission screen  

## 2015-11-14 MED ORDER — LABETALOL HCL 5 MG/ML IV SOLN: 20.0000 mg | INTRAVENOUS | Status: DC | PRN

## 2015-11-14 MED ORDER — HYDRALAZINE HCL 20 MG/ML IJ SOLN: 10.0000 mg | Freq: Once | INTRAMUSCULAR | Status: DC | PRN

## 2015-11-14 NOTE — H&P (Signed)
Ashley [name] is a 30 y.o. female, G2 P1001, EGA 37+ weeks with EDC 2-10 presenting for cervical ripening and induction for PIH.  BP elevated since 33 weeks, started on Labetalol 100 mg bid at 35 weeks for 160/100.  [place] labs normal, no significant proteinuria, reactive NSTs.  Prenatal care otherwise uncomplicated.  Maternal Medical History:  Fetal activity: Perceived fetal activity is normal.    Prenatal complications: PIH.     OB History    Gravida Para Term Preterm AB TAB SAB Ectopic Multiple Living   2 1 1       1     SVD at term, induced for [facility]  Past Medical History  Diagnosis Date  . Back injury June 2014    work related  . Anxiety     prn xanax none since pregnant  . Hypertension    Past Surgical History  Procedure Laterality Date  . Appendectomy     Family History: family history includes Hypertension in her father; Kidney disease in her father. There is no history of Alcohol abuse, Arthritis, Asthma, Birth defects, Cancer, COPD, Depression, Diabetes, Drug abuse, Early death, Hearing loss, Heart disease, Hyperlipidemia, Learning disabilities, Mental illness, Mental retardation, Miscarriages / Stillbirths, Stroke, Vision loss, or Varicose Veins. Social History:  reports that she has never smoked. She has never used smokeless tobacco. She reports that she does not drink alcohol or use illicit drugs.   Prenatal Transfer Tool  Maternal Diabetes: No Genetic Screening: Normal Maternal Ultrasounds/Referrals: Normal Fetal Ultrasounds or other Referrals:  None Maternal Substance Abuse:  No Significant Maternal Medications:  Meds include: Other:  Significant Maternal Lab Results:  Lab values include: Group B Strep positive Other Comments:  PIH, on Labetalol  Review of Systems  Respiratory: Negative.   Cardiovascular: Negative.       Last menstrual period 01/21/2015. Maternal Exam:  Abdomen: Estimated fetal weight is 7 lbs.   Fetal presentation: vertex  Introitus:  Normal vulva. Normal vagina.  Amniotic fluid character: not assessed.  Pelvis: adequate for delivery.      Physical Exam  Constitutional: She appears well-developed and well-nourished.  Cardiovascular: Normal rate, regular rhythm and normal heart sounds.   No murmur heard. Respiratory: Effort normal and breath sounds normal. No respiratory distress. She has no wheezes.  GI: Soft.    Prenatal labs: ABO, Rh: O/Positive/-- (07/18 0000) Antibody: Negative (07/18 0000) Rubella: Immune (07/18 0000) RPR: Nonreactive (07/18 0000)  HBsAg: Negative (07/18 0000)  HIV: Non-reactive (07/18 0000)  GBS: Positive (01/18 0000)   Assessment/Plan: IUP at 37+ weeks, PIH on Labetalol, cervix unfavorable, +GBS.  Will admit for cervical ripening and induction, recheck [place] labs, treat BP prn, PCN for +GBS.  Sacha Radloff D 11/14/2015, 8:15 PM

## 2015-11-15 ENCOUNTER — Inpatient Hospital Stay (HOSPITAL_COMMUNITY): Payer: 59 | Admitting: Anesthesiology

## 2015-11-15 ENCOUNTER — Inpatient Hospital Stay (HOSPITAL_COMMUNITY)
Admission: RE | Admit: 2015-11-15 | Discharge: 2015-11-17 | DRG: 775 | Disposition: A | Discharge status: Home / Self Care | Payer: 59 | Source: Ambulatory Visit | Attending: Obstetrics and Gynecology | Admitting: Obstetrics and Gynecology

## 2015-11-15 ENCOUNTER — Encounter (HOSPITAL_COMMUNITY): Payer: Self-pay

## 2015-11-15 DIAGNOSIS — O134 Gestational [pregnancy-induced] hypertension without significant proteinuria, complicating childbirth: Principal | ICD-10-CM | POA: Diagnosis present

## 2015-11-15 DIAGNOSIS — Z8249 Family history of ischemic heart disease and other diseases of the circulatory system: Secondary | ICD-10-CM

## 2015-11-15 DIAGNOSIS — O133 Gestational [pregnancy-induced] hypertension without significant proteinuria, third trimester: Secondary | ICD-10-CM | POA: Diagnosis present

## 2015-11-15 DIAGNOSIS — Z6841 Body Mass Index (BMI) 40.0 and over, adult: Secondary | ICD-10-CM | POA: Diagnosis not present

## 2015-11-15 DIAGNOSIS — O99824 Streptococcus B carrier state complicating childbirth: Secondary | ICD-10-CM | POA: Diagnosis present

## 2015-11-15 DIAGNOSIS — Z3A37 37 weeks gestation of pregnancy: Secondary | ICD-10-CM | POA: Diagnosis not present

## 2015-11-15 DIAGNOSIS — O99214 Obesity complicating childbirth: Secondary | ICD-10-CM | POA: Diagnosis present

## 2015-11-15 LAB — COMPREHENSIVE METABOLIC PANEL
ALBUMIN: 2.9 g/dL — AB (ref 3.5–5.0)
ALT: 23 U/L (ref 14–54)
AST: 23 U/L (ref 15–41)
Alkaline Phosphatase: 194 U/L — ABNORMAL HIGH (ref 38–126)
Anion gap: 11 (ref 5–15)
BUN: 9 mg/dL (ref 6–20)
CHLORIDE: 106 mmol/L (ref 101–111)
CO2: 20 mmol/L — AB (ref 22–32)
Calcium: 9.3 mg/dL (ref 8.9–10.3)
Creatinine, Ser: 0.47 mg/dL (ref 0.44–1.00)
GFR calc Af Amer: >60 mL/min (ref >60)
GFR calc non Af Amer: >60 mL/min (ref >60)
GLUCOSE: 92 mg/dL (ref 65–99)
POTASSIUM: 4.1 mmol/L (ref 3.5–5.1)
Sodium: 137 mmol/L (ref 135–145)
Total Bilirubin: 0.4 mg/dL (ref 0.3–1.2)
Total Protein: 6.1 g/dL — ABNORMAL LOW (ref 6.5–8.1)

## 2015-11-15 LAB — TYPE AND SCREEN
ABO/RH(D): O POS
ANTIBODY SCREEN: NEGATIVE

## 2015-11-15 LAB — CBC
HCT: 34.7 % — ABNORMAL LOW (ref 36.0–46.0)
HEMATOCRIT: 35.1 % — AB (ref 36.0–46.0)
HEMOGLOBIN: 11.6 g/dL — AB (ref 12.0–15.0)
Hemoglobin: 11.6 g/dL — ABNORMAL LOW (ref 12.0–15.0)
MCH: 29.5 pg (ref 26.0–34.0)
MCH: 29.6 pg (ref 26.0–34.0)
MCHC: 33.4 g/dL (ref 30.0–36.0)
MCHC: 33 g/dL (ref 30.0–36.0)
MCV: 88.3 fL (ref 78.0–100.0)
MCV: 89.5 fL (ref 78.0–100.0)
Platelets: 196 10*3/uL (ref 150–400)
Platelets: 170 10*3/uL (ref 150–400)
RBC: 3.93 MIL/uL (ref 3.87–5.11)
RBC: 3.92 MIL/uL (ref 3.87–5.11)
RDW: 13.3 % (ref 11.5–15.5)
RDW: 13.6 % (ref 11.5–15.5)
WBC: 9.9 10*3/uL (ref 4.0–10.5)
WBC: 9.5 10*3/uL (ref 4.0–10.5)

## 2015-11-15 LAB — RPR: RPR Ser Ql: NONREACTIVE

## 2015-11-15 MED ORDER — CITRIC ACID-SODIUM CITRATE 334-500 MG/5ML PO SOLN
30.0000 mL | ORAL | Status: DC | PRN
  Administered 2015-11-15: 30 mL via ORAL
  Filled 2015-11-15: qty 15

## 2015-11-15 MED ORDER — LACTATED RINGERS IV SOLN
INTRAVENOUS | Status: DC
  Administered 2015-11-15 (×3): via INTRAVENOUS

## 2015-11-15 MED ORDER — LIDOCAINE HCL (PF) 1 % IJ SOLN
INTRAMUSCULAR | Status: DC | PRN
  Administered 2015-11-15 (×2): 8 mL via EPIDURAL

## 2015-11-15 MED ORDER — MISOPROSTOL 25 MCG QUARTER TABLET
25.0000 ug | ORAL_TABLET | ORAL | Status: DC | PRN
  Administered 2015-11-15: 25 ug via VAGINAL
  Filled 2015-11-15: qty 1
  Filled 2015-11-15 (×2): qty 0.25

## 2015-11-15 MED ORDER — DEXTROSE 5 % IV SOLN
5.0000 10*6.[IU] | Freq: Once | INTRAVENOUS | Status: AC
  Administered 2015-11-15: 5 10*6.[IU] via INTRAVENOUS
  Filled 2015-11-15: qty 5

## 2015-11-15 MED ORDER — BUTORPHANOL TARTRATE 1 MG/ML IJ SOLN: 1.0000 mg | INTRAMUSCULAR | Status: DC | PRN

## 2015-11-15 MED ORDER — LACTATED RINGERS IV SOLN
500.0000 mL | INTRAVENOUS | Status: DC | PRN
  Administered 2015-11-15 (×2): 500 mL via INTRAVENOUS

## 2015-11-15 MED ORDER — OXYCODONE-ACETAMINOPHEN 5-325 MG PO TABS: 1.0000 | ORAL_TABLET | ORAL | Status: DC | PRN

## 2015-11-15 MED ORDER — PENICILLIN G POTASSIUM 5000000 UNITS IJ SOLR
2.5000 10*6.[IU] | INTRAVENOUS | Status: DC
  Administered 2015-11-15 (×4): 2.5 10*6.[IU] via INTRAVENOUS
  Filled 2015-11-15 (×10): qty 2.5

## 2015-11-15 MED ORDER — DIPHENHYDRAMINE HCL 50 MG/ML IJ SOLN: 12.5000 mg | INTRAMUSCULAR | Status: DC | PRN

## 2015-11-15 MED ORDER — OXYTOCIN BOLUS FROM INFUSION
500.0000 mL | INTRAVENOUS | Status: DC
  Administered 2015-11-15: 500 mL via INTRAVENOUS

## 2015-11-15 MED ORDER — EPHEDRINE 5 MG/ML INJ
10.0000 mg | INTRAVENOUS | Status: DC | PRN
  Filled 2015-11-15: qty 2

## 2015-11-15 MED ORDER — OXYTOCIN 10 UNIT/ML IJ SOLN
2.5000 [IU]/h | INTRAMUSCULAR | Status: DC
  Filled 2015-11-15: qty 4

## 2015-11-15 MED ORDER — ONDANSETRON HCL 4 MG/2ML IJ SOLN
4.0000 mg | Freq: Four times a day (QID) | INTRAMUSCULAR | Status: DC | PRN
  Administered 2015-11-15: 4 mg via INTRAVENOUS
  Filled 2015-11-15: qty 2

## 2015-11-15 MED ORDER — FAMOTIDINE IN NACL 20-0.9 MG/50ML-% IV SOLN
20.0000 mg | Freq: Once | INTRAVENOUS | Status: AC
  Administered 2015-11-15: 20 mg via INTRAVENOUS
  Filled 2015-11-15: qty 50

## 2015-11-15 MED ORDER — OXYTOCIN 10 UNIT/ML IJ SOLN
1.0000 m[IU]/min | INTRAVENOUS | Status: DC
  Administered 2015-11-15: 2 m[IU]/min via INTRAVENOUS

## 2015-11-15 MED ORDER — FENTANYL 2.5 MCG/ML BUPIVACAINE 1/10 % EPIDURAL INFUSION (WH - ANES)
14.0000 mL/h | INTRAMUSCULAR | Status: DC | PRN
  Administered 2015-11-15 (×2): 14 mL/h via EPIDURAL
  Filled 2015-11-15 (×2): qty 125

## 2015-11-15 MED ORDER — LIDOCAINE HCL (PF) 1 % IJ SOLN
30.0000 mL | INTRAMUSCULAR | Status: DC | PRN
  Filled 2015-11-15: qty 30

## 2015-11-15 MED ORDER — TERBUTALINE SULFATE 1 MG/ML IJ SOLN
0.2500 mg | Freq: Once | INTRAMUSCULAR | Status: DC | PRN
  Filled 2015-11-15: qty 1

## 2015-11-15 MED ORDER — OXYCODONE-ACETAMINOPHEN 5-325 MG PO TABS: 2.0000 | ORAL_TABLET | ORAL | Status: DC | PRN

## 2015-11-15 MED ORDER — PHENYLEPHRINE 40 MCG/ML (10ML) SYRINGE FOR IV PUSH (FOR BLOOD PRESSURE SUPPORT)
80.0000 ug | PREFILLED_SYRINGE | INTRAVENOUS | Status: DC | PRN
  Administered 2015-11-15 (×2): 80 ug via INTRAVENOUS
  Filled 2015-11-15: qty 2
  Filled 2015-11-15: qty 20

## 2015-11-15 MED ORDER — ZOLPIDEM TARTRATE 5 MG PO TABS: 5.0000 mg | ORAL_TABLET | Freq: Every evening | ORAL | Status: DC | PRN

## 2015-11-15 NOTE — Progress Notes (Signed)
Feeling more ctx Afeb, VSS, BP 150/90 FHT- Cat 1 VE-4-5/50/-2, vtx, clear fluid leaking Continue pitocin and PCN, treat BP prn, monitor progress and anticipate SVD

## 2015-11-15 NOTE — Progress Notes (Signed)
Comfortable with epidural Afeb, VSS, BP normal/low since epidural FHT- Cat II, mod variability, intermittent variable decel, + accels, + scalp stim, ctx not tracing well VE-5-6/50/-2, vtx, IUPC inserted Has a protracted course, BP ok.  IUPC inserted to better monitor ctx and adjust pitocin, continue PCN for +GBS, monitor progress

## 2015-11-15 NOTE — Anesthesia Preprocedure Evaluation (Signed)
Anesthesia Evaluation  Patient identified by MRN, date of birth, ID band Patient awake    Reviewed: Allergy & Precautions, H&P , NPO status , Patient's Chart, lab work & pertinent test results, reviewed documented beta blocker date and time   Airway Mallampati: II  TM Distance: >3 FB Neck ROM: full    Dental no notable dental hx.    Pulmonary neg pulmonary ROS,    Pulmonary exam normal        Cardiovascular hypertension, Pt. on home beta blockers Normal cardiovascular exam     Neuro/Psych negative neurological ROS     GI/Hepatic negative GI ROS, Neg liver ROS,   Endo/Other  Morbid obesity  Renal/GU negative Renal ROS     Musculoskeletal   Abdominal (+) + obese,   Peds  Hematology negative hematology ROS (+)   Anesthesia Other Findings   Reproductive/Obstetrics (+) Pregnancy                             Anesthesia Physical Anesthesia Plan  ASA: III  Anesthesia Plan: Epidural   Post-op Pain Management:    Induction:   Airway Management Planned:   Additional Equipment:   Intra-op Plan:   Post-operative Plan:   Informed Consent: I have reviewed the patients History and Physical, chart, labs and discussed the procedure including the risks, benefits and alternatives for the proposed anesthesia with the patient or authorized representative who has indicated his/her understanding and acceptance.     Plan Discussed with:   Anesthesia Plan Comments:         Anesthesia Quick Evaluation

## 2015-11-15 NOTE — Anesthesia Procedure Notes (Signed)
Epidural Patient location during procedure: OB Start time: 11/15/2015 12:41 PM End time: 11/15/2015 12:45 PM  Staffing Anesthesiologist: Lyn Hollingshead Performed by: anesthesiologist   Preanesthetic Checklist Completed: patient identified, surgical consent, pre-op evaluation, timeout performed, IV checked, risks and benefits discussed and monitors and equipment checked  Epidural Patient position: sitting Prep: site prepped and draped and DuraPrep Patient monitoring: continuous pulse ox and blood pressure Approach: midline Location: L3-L4 Injection technique: LOR air  Needle:  Needle type: Tuohy  Needle gauge: 17 G Needle length: 9 cm and 9 Needle insertion depth: 7 cm Catheter type: closed end flexible Catheter size: 19 Gauge Catheter at skin depth: 12 cm Test dose: negative and Other  Assessment Sensory level: T9 Events: blood not aspirated, injection not painful, no injection resistance, negative IV test and no paresthesia  Additional Notes Reason for block:procedure for pain

## 2015-11-15 NOTE — Progress Notes (Signed)
Pt admitted last pm for ripening for PIH, received one dose of cytotec She has felt some ctx, not uncomfortable Afeb, VSS, BP normal so far here despite being on Labetalol prior to admission FHT- Cat I VE-3-4/30/-3, vtx, attempted AROM, no fluid.  Exams by RN overnight must have been of external os Will start pitocin, continue PCN, monitor BP and progress

## 2015-11-16 LAB — COMPREHENSIVE METABOLIC PANEL
ALBUMIN: 2.6 g/dL — AB (ref 3.5–5.0)
ALK PHOS: 162 U/L — AB (ref 38–126)
ALT: 27 U/L (ref 14–54)
ANION GAP: 10 (ref 5–15)
AST: 41 U/L (ref 15–41)
BUN: 11 mg/dL (ref 6–20)
CALCIUM: 9.1 mg/dL (ref 8.9–10.3)
CHLORIDE: 105 mmol/L (ref 101–111)
CO2: 23 mmol/L (ref 22–32)
CREATININE: 0.73 mg/dL (ref 0.44–1.00)
GFR calc non Af Amer: >60 mL/min (ref >60)
GLUCOSE: 122 mg/dL — AB (ref 65–99)
Potassium: 4.1 mmol/L (ref 3.5–5.1)
SODIUM: 138 mmol/L (ref 135–145)
Total Bilirubin: 0.8 mg/dL (ref 0.3–1.2)
Total Protein: 6.1 g/dL — ABNORMAL LOW (ref 6.5–8.1)

## 2015-11-16 LAB — CBC
HCT: 34.9 % — ABNORMAL LOW (ref 36.0–46.0)
HEMOGLOBIN: 11.7 g/dL — AB (ref 12.0–15.0)
MCH: 30.1 pg (ref 26.0–34.0)
MCHC: 33.5 g/dL (ref 30.0–36.0)
MCV: 89.7 fL (ref 78.0–100.0)
PLATELETS: 183 10*3/uL (ref 150–400)
RBC: 3.89 MIL/uL (ref 3.87–5.11)
RDW: 13.7 % (ref 11.5–15.5)
WBC: 17.3 10*3/uL — ABNORMAL HIGH (ref 4.0–10.5)

## 2015-11-16 MED ORDER — TETANUS-DIPHTH-ACELL PERTUSSIS 5-2.5-18.5 LF-MCG/0.5 IM SUSP
0.5000 mL | Freq: Once | INTRAMUSCULAR | Status: AC
  Administered 2015-11-16: 0.5 mL via INTRAMUSCULAR
  Filled 2015-11-16: qty 0.5

## 2015-11-16 MED ORDER — SIMETHICONE 80 MG PO CHEW: 80.0000 mg | CHEWABLE_TABLET | ORAL | Status: DC | PRN

## 2015-11-16 MED ORDER — CYCLOBENZAPRINE HCL 5 MG PO TABS
5.0000 mg | ORAL_TABLET | Freq: Every day | ORAL | Status: DC | PRN
  Filled 2015-11-16: qty 1

## 2015-11-16 MED ORDER — ACETAMINOPHEN 325 MG PO TABS: 650.0000 mg | ORAL_TABLET | ORAL | Status: DC | PRN

## 2015-11-16 MED ORDER — ZOLPIDEM TARTRATE 5 MG PO TABS: 5.0000 mg | ORAL_TABLET | Freq: Every evening | ORAL | Status: DC | PRN

## 2015-11-16 MED ORDER — ONDANSETRON HCL 4 MG/2ML IJ SOLN: 4.0000 mg | INTRAMUSCULAR | Status: DC | PRN

## 2015-11-16 MED ORDER — PRENATAL MULTIVITAMIN CH
1.0000 | ORAL_TABLET | Freq: Every day | ORAL | Status: DC
  Administered 2015-11-16: 1 via ORAL
  Filled 2015-11-16: qty 1

## 2015-11-16 MED ORDER — MAGNESIUM HYDROXIDE 400 MG/5ML PO SUSP: 30.0000 mL | ORAL | Status: DC | PRN

## 2015-11-16 MED ORDER — OXYCODONE HCL 5 MG PO TABS: 10.0000 mg | ORAL_TABLET | ORAL | Status: DC | PRN

## 2015-11-16 MED ORDER — DIBUCAINE 1 % RE OINT: 1.0000 "application " | TOPICAL_OINTMENT | RECTAL | Status: DC | PRN

## 2015-11-16 MED ORDER — DIPHENHYDRAMINE HCL 25 MG PO CAPS: 25.0000 mg | ORAL_CAPSULE | Freq: Four times a day (QID) | ORAL | Status: DC | PRN

## 2015-11-16 MED ORDER — SENNOSIDES-DOCUSATE SODIUM 8.6-50 MG PO TABS
2.0000 | ORAL_TABLET | ORAL | Status: DC
  Administered 2015-11-17: 2 via ORAL
  Filled 2015-11-16: qty 2

## 2015-11-16 MED ORDER — MEASLES, MUMPS & RUBELLA VAC ~~LOC~~ INJ: 0.5000 mL | INJECTION | Freq: Once | SUBCUTANEOUS | Status: DC

## 2015-11-16 MED ORDER — LANOLIN HYDROUS EX OINT: TOPICAL_OINTMENT | CUTANEOUS | Status: DC | PRN

## 2015-11-16 MED ORDER — ONDANSETRON HCL 4 MG PO TABS: 4.0000 mg | ORAL_TABLET | ORAL | Status: DC | PRN

## 2015-11-16 MED ORDER — IBUPROFEN 600 MG PO TABS
600.0000 mg | ORAL_TABLET | Freq: Four times a day (QID) | ORAL | Status: DC
  Administered 2015-11-16 – 2015-11-17 (×5): 600 mg via ORAL
  Filled 2015-11-16 (×6): qty 1

## 2015-11-16 MED ORDER — BENZOCAINE-MENTHOL 20-0.5 % EX AERO: 1.0000 "application " | INHALATION_SPRAY | CUTANEOUS | Status: DC | PRN

## 2015-11-16 MED ORDER — WITCH HAZEL-GLYCERIN EX PADS: 1.0000 "application " | MEDICATED_PAD | CUTANEOUS | Status: DC | PRN

## 2015-11-16 MED ORDER — OXYCODONE HCL 5 MG PO TABS
5.0000 mg | ORAL_TABLET | ORAL | Status: DC | PRN
  Administered 2015-11-16 (×2): 5 mg via ORAL
  Filled 2015-11-16 (×2): qty 1

## 2015-11-16 NOTE — Progress Notes (Signed)
Delayed entry-pt seen at 0845 PPD #1 No problems Afeb, VSS, BP a little labile but not consistently elevated Labs stable Continue routine postpartum care, monitor BP and treat prn

## 2015-11-16 NOTE — Anesthesia Postprocedure Evaluation (Signed)
Anesthesia Post Note  Patient: Ashley [name]  Procedure(s) Performed: * No procedures listed *  Patient location during evaluation: [facility] Anesthesia Type: Epidural Level of consciousness: awake and alert Pain management: satisfactory to patient Vital Signs Assessment: post-procedure vital signs reviewed and stable Respiratory status: respiratory function stable Cardiovascular status: stable Postop Assessment: no headache, no backache, epidural receding, patient able to bend at knees, no signs of nausea or vomiting and adequate PO intake Anesthetic complications: no    Last Vitals:  Filed Vitals:   11/16/15 0136 11/16/15 0511  BP: 128/66 129/71  Pulse: 120 117  Temp: 37 C 36.9 C  Resp: 18 18    Last Pain:  Filed Vitals:   11/16/15 0847  PainSc: 0-No pain                 Gregor Dershem, Darci Needle

## 2015-11-16 NOTE — Progress Notes (Signed)
MOB was referred for history of depression/anxiety.  Referral is screened out by Clinical Social Worker because none of the following criteria appear to apply: -History of anxiety/depression during this pregnancy, or of post-partum depression. - Diagnosis of anxiety and/or depression within last 3 years. Per chart review, history pre-dates 2012. No concerns during the pregnancy.   or -MOB's symptoms are currently being treated with medication and/or therapy.  Please contact the Clinical Social Worker if needs arise or upon MOB request.

## 2015-11-16 NOTE — Lactation Note (Signed)
This note was copied from the chart of [place]. Lactation Consultation Note  Initial visit made.  Breastfeeding consultation services and support information given and reviewed.  Mom breastfed her first baby for 4 months and reports a good supply.  She states newborn is latching easily but nipple shape has lipstick appearance when baby comes off.  Discussed importance of baby obtaining a deep latch.  Instructed to feed with any cue.  Reviewed cluster feeding on day 2-3.  Encouraged to call for assist with latch prn.  Patient Name: Ashley [name] Ear [id number] Date: 11/16/2015 Reason for consult: Initial assessment   Maternal Data Formula Feeding for Exclusion: No Does the patient have breastfeeding experience prior to this delivery?: Yes  Feeding Feeding Type: Breast Fed Length of feed: 20 min  LATCH Score/Interventions Latch: Repeated attempts needed to sustain latch, nipple held in mouth throughout feeding, stimulation needed to elicit sucking reflex. Intervention(s): Adjust position;Breast massage  Audible Swallowing: A few with stimulation Intervention(s): Skin to skin;Hand expression  Type of Nipple: Everted at rest and after stimulation  Comfort (Breast/Nipple): Soft / non-tender     Hold (Positioning): Assistance needed to correctly position infant at breast and maintain latch. Intervention(s): Breastfeeding basics reviewed;Support Pillows;Position options  LATCH Score: 7  Lactation Tools Discussed/Used     Consult Status Consult Status: Follow-up Date: 11/17/15 Follow-up type: In-patient    Ave Filter 11/16/2015, 11:33 AM

## 2015-11-16 NOTE — Plan of Care (Signed)
Problem: Activity: Goal: Ability to tolerate increased activity will improve Outcome: Progressing Legs- weak  Problem: Coping: Goal: Ability to cope will improve Outcome: Progressing Hx- anxiety  Problem: Urinary Elimination: Goal: Ability to reestablish a normal urinary elimination pattern will improve Outcome: Progressing Due to void - since transfer from L&D

## 2015-11-17 MED ORDER — OXYCODONE HCL 5 MG PO TABS: 5.0000 mg | ORAL_TABLET | ORAL | Status: AC | PRN

## 2015-11-17 MED ORDER — IBUPROFEN 600 MG PO TABS: 600.0000 mg | ORAL_TABLET | Freq: Four times a day (QID) | ORAL | Status: AC

## 2015-11-17 MED FILL — oxyCODONE HCL 5 MG TABS: 5 | 3 days supply | Qty: 20 | Fill #0

## 2015-11-17 NOTE — Lactation Note (Signed)
This note was copied from the chart of [place]. Lactation Consultation Note  Patient Name: Ashley [name] [id number] Date: 11/17/2015 Reason for consult: Follow-up assessment;Other (Comment) (374/7, 4% weight loss)  Baby 59 hours old , baby has been breast feeding consistently, post circ this am and awake  And rooting. LC assisted mom to position baby. Mom latched the baby , LC checked the depth.  Baby fed for 15 mins , multiply swallows, increased with breast compressions.  Sore nipple and engorgement prevention and tx reviewed. Per mom will be obtaining a DEBP this am .  For now will be using it PRN for when the milk comes in, along with hand expressing.  Mother informed of post-discharge support and given phone number to the lactation department, including services for phone call assistance; out-patient appointments; and breastfeeding support group. List of other breastfeeding resources in the community given in the handout. Encouraged mother to call for problems or concerns related to breastfeeding.   Maternal Data Has patient been taught Hand Expression?: Yes  Feeding Feeding Type: Breast Fed Length of feed: 15 min (multiply swallows, increased with breast compressions )  LATCH Score/Interventions Latch: Grasps breast easily, tongue down, lips flanged, rhythmical sucking. Intervention(s): Adjust position;Assist with latch;Breast massage;Breast compression  Audible Swallowing: Spontaneous and intermittent Intervention(s): Skin to skin  Type of Nipple: Everted at rest and after stimulation  Comfort (Breast/Nipple): Soft / non-tender     Hold (Positioning): Assistance needed to correctly position infant at breast and maintain latch. Intervention(s): Breastfeeding basics reviewed;Support Pillows;Position options;Skin to skin  LATCH Score: 9  Lactation Tools Discussed/Used WIC Program: No   Consult Status Consult Status: Complete Date: 11/17/15    Myer Haff 11/17/2015, 9:44 AM

## 2015-11-17 NOTE — Progress Notes (Signed)
PPD #2 Doing ok, no problems Afeb, VSS, sl tachy, BP nl Fundus firm Stable for d/c home today since BP is normal

## 2015-11-17 NOTE — Discharge Instructions (Signed)
As per discharge pamphlet °

## 2015-11-17 NOTE — Discharge Summary (Signed)
OB Discharge Summary     Patient Name: Ashley [name] DOB: November 10, 1985 MRN: [id number]  Date of admission: 11/15/2015 Delivering MD: Willis Modena, Adaijah Endres   Date of discharge: 11/17/2015  Admitting diagnosis: INDUCTION Intrauterine pregnancy: [redacted]w[redacted]d     Secondary diagnosis:  Active Problems:   Gestational hypertension w/o significant proteinuria in 3rd trimester   SVD (spontaneous vaginal delivery)      Discharge diagnosis: Term Pregnancy Delivered and Gestational Hypertension                                                                                                 Augmentation: Pitocin and Cytotec  Complications: None  Hospital course:  Induction of Labor With Vaginal Delivery   30 y.o. yo [id number] at [redacted]w[redacted]d was admitted to the hospital 11/15/2015 for induction of labor.  Indication for induction: Gestational hypertension.  Patient had an uncomplicated labor course as follows: Membrane Rupture Time/Date: 7:50 AM ,11/15/2015   Intrapartum Procedures: Episiotomy: None [1]                                         Lacerations:  None [1]  Patient had delivery of a Viable infant.  Information for the patient's newborn:  Leanda, Braasch [id number]  Delivery Method: Vaginal, Spontaneous Delivery (Filed from Delivery Summary)   11/15/2015  Details of delivery can be found in separate delivery note.  Patient had a routine postpartum course. Patient is discharged home 11/17/2015.   Physical exam  Filed Vitals:   11/16/15 0511 11/16/15 1117 11/16/15 1755 11/17/15 0642  BP: 129/71 126/61 139/87 140/81  Pulse: 117 118 112 94  Temp: 98.4 F (36.9 C) 98 F (36.7 C) 98 F (36.7 C) 98.4 F (36.9 C)  TempSrc: Oral Oral Oral Oral  Resp: 18 20 19 18   Height:      Weight:      SpO2:    96%   General: alert Lochia: appropriate Uterine Fundus: firm  Labs: Lab Results  Component Value Date   WBC 17.3* 11/16/2015   HGB 11.7* 11/16/2015   HCT 34.9* 11/16/2015   MCV 89.7  11/16/2015   PLT 183 11/16/2015   CMP Latest Ref Rng 11/16/2015  Glucose 65 - 99 mg/dL 122(H)  BUN 6 - 20 mg/dL 11  Creatinine 0.44 - 1.00 mg/dL 0.73  Sodium 135 - 145 mmol/L 138  Potassium 3.5 - 5.1 mmol/L 4.1  Chloride 101 - 111 mmol/L 105  CO2 22 - 32 mmol/L 23  Calcium 8.9 - 10.3 mg/dL 9.1  Total Protein 6.5 - 8.1 g/dL 6.1(L)  Total Bilirubin 0.3 - 1.2 mg/dL 0.8  Alkaline Phos 38 - 126 U/L 162(H)  AST 15 - 41 U/L 41  ALT 14 - 54 U/L 27    Discharge instruction: per After Visit Summary and "Baby and Me Booklet".  After visit meds:    Medication List    STOP taking these medications        labetalol 100 MG tablet  Commonly  known as:  NORMODYNE      TAKE these medications        acetaminophen 500 MG tablet  Commonly known as:  TYLENOL  Take 500 mg by mouth every 6 (six) hours as needed for headache.     calcium carbonate 500 MG chewable tablet  Commonly known as:  TUMS - dosed in mg elemental calcium  Chew 2 tablets by mouth 3 (three) times daily as needed for indigestion or heartburn.     cyclobenzaprine 5 MG tablet  Commonly known as:  FLEXERIL  Take 5 mg by mouth daily as needed for muscle spasms.     ibuprofen 600 MG tablet  Commonly known as:  ADVIL,MOTRIN  Take 1 tablet (600 mg total) by mouth every 6 (six) hours.     oxyCODONE 5 MG immediate release tablet  Commonly known as:  Oxy IR/ROXICODONE  Take 1 tablet (5 mg total) by mouth every 4 (four) hours as needed for severe pain.     prenatal multivitamin Tabs tablet  Take 1 tablet by mouth daily at 12 noon.     ranitidine 150 MG tablet  Commonly known as:  ZANTAC  Take 150 mg by mouth 2 (two) times daily.        Diet: routine diet  Activity: Advance as tolerated. Pelvic rest for 6 weeks.   Outpatient follow up:one week for BP check   Newborn Data: Live born female  Birth Weight: 7 lb 6.7 oz (3365 g) APGAR: 8, 9  Baby Feeding: Breast Disposition:home with  mother   11/17/2015 Clarene Duke, MD

## 2015-11-21 ENCOUNTER — Encounter (HOSPITAL_COMMUNITY): Payer: Self-pay

## 2015-12-05 DIAGNOSIS — B079 Viral wart, unspecified: Secondary | ICD-10-CM | POA: Diagnosis not present

## 2015-12-05 DIAGNOSIS — D225 Melanocytic nevi of trunk: Secondary | ICD-10-CM | POA: Diagnosis not present

## 2015-12-05 DIAGNOSIS — L570 Actinic keratosis: Secondary | ICD-10-CM | POA: Diagnosis not present

## 2015-12-05 DIAGNOSIS — D485 Neoplasm of uncertain behavior of skin: Secondary | ICD-10-CM | POA: Diagnosis not present

## 2015-12-22 DIAGNOSIS — M5136 Other intervertebral disc degeneration, lumbar region: Secondary | ICD-10-CM | POA: Diagnosis not present

## 2015-12-22 DIAGNOSIS — G8929 Other chronic pain: Secondary | ICD-10-CM | POA: Diagnosis not present

## 2015-12-22 DIAGNOSIS — M5441 Lumbago with sciatica, right side: Secondary | ICD-10-CM | POA: Diagnosis not present

## 2015-12-22 MED FILL — traMADol HCL 50 MG TABS: 50 | 20 days supply | Qty: 60 | Fill #0

## 2016-01-04 DIAGNOSIS — M5136 Other intervertebral disc degeneration, lumbar region: Secondary | ICD-10-CM | POA: Diagnosis not present

## 2016-01-05 DIAGNOSIS — O99345 Other mental disorders complicating the puerperium: Secondary | ICD-10-CM | POA: Diagnosis not present

## 2016-01-05 DIAGNOSIS — N764 Abscess of vulva: Secondary | ICD-10-CM | POA: Diagnosis not present

## 2016-01-05 MED FILL — BUPROPION HCL XL 150 MG TAB: 150 | 30 days supply | Qty: 30 | Fill #0

## 2016-01-19 DIAGNOSIS — M5441 Lumbago with sciatica, right side: Secondary | ICD-10-CM | POA: Diagnosis not present

## 2016-01-19 DIAGNOSIS — G8929 Other chronic pain: Secondary | ICD-10-CM | POA: Diagnosis not present

## 2016-01-19 DIAGNOSIS — M5136 Other intervertebral disc degeneration, lumbar region: Secondary | ICD-10-CM | POA: Diagnosis not present

## 2016-01-19 DIAGNOSIS — Z79891 Long term (current) use of opiate analgesic: Secondary | ICD-10-CM | POA: Diagnosis not present

## 2016-01-19 MED FILL — traMADol HCL 50 MG TABS: 50 | 30 days supply | Qty: 90 | Fill #0

## 2016-01-20 DIAGNOSIS — Z30430 Encounter for insertion of intrauterine contraceptive device: Secondary | ICD-10-CM | POA: Diagnosis not present

## 2016-02-07 MED FILL — BUPROPION HCL XL 150 MG TAB: 150 | 30 days supply | Qty: 30 | Fill #0

## 2016-02-20 MED FILL — traMADol HCL 50 MG TABS: 50 | 30 days supply | Qty: 90 | Fill #1

## 2016-02-21 DIAGNOSIS — G8929 Other chronic pain: Secondary | ICD-10-CM | POA: Diagnosis not present

## 2016-02-21 DIAGNOSIS — M5441 Lumbago with sciatica, right side: Secondary | ICD-10-CM | POA: Diagnosis not present

## 2016-02-21 DIAGNOSIS — M5136 Other intervertebral disc degeneration, lumbar region: Secondary | ICD-10-CM | POA: Diagnosis not present

## 2016-03-01 DIAGNOSIS — E663 Overweight: Secondary | ICD-10-CM | POA: Diagnosis not present

## 2016-03-01 DIAGNOSIS — F321 Major depressive disorder, single episode, moderate: Secondary | ICD-10-CM | POA: Diagnosis not present

## 2016-03-01 DIAGNOSIS — E6609 Other obesity due to excess calories: Secondary | ICD-10-CM | POA: Diagnosis not present

## 2016-03-01 DIAGNOSIS — Z79899 Other long term (current) drug therapy: Secondary | ICD-10-CM | POA: Diagnosis not present

## 2016-04-19 MED FILL — traMADol HCL 50 MG TABS: 50 | 30 days supply | Qty: 90 | Fill #2

## 2016-05-24 MED FILL — traMADol HCL 50 MG TABS: 50 | 30 days supply | Qty: 90 | Fill #0

## 2021-06-27 ENCOUNTER — Other Ambulatory Visit: Payer: Self-pay | Admitting: Chiropractic Medicine

## 2021-06-27 DIAGNOSIS — M545 Low back pain, unspecified: Secondary | ICD-10-CM

## 2021-07-12 ENCOUNTER — Other Ambulatory Visit: Payer: 59

## 2021-07-20 ENCOUNTER — Ambulatory Visit
Admission: RE | Admit: 2021-07-20 | Discharge: 2021-07-20 | Disposition: A | Discharge status: Home / Self Care | Source: Ambulatory Visit | Attending: Chiropractic Medicine | Admitting: Chiropractic Medicine

## 2021-07-20 ENCOUNTER — Other Ambulatory Visit: Payer: Self-pay

## 2021-07-20 DIAGNOSIS — M545 Low back pain, unspecified: Secondary | ICD-10-CM

## 2023-01-15 IMAGING — MR MR LUMBAR SPINE W/O CM
4 of 6 series · 17 of 48 positions shown · non-contrast
Comparison: MRI lumbar spine 09/16/2017

CLINICAL DATA: Low back pain.

EXAM:
MRI LUMBAR SPINE WITHOUT CONTRAST
TECHNIQUE: Multiplanar, multisequence MR imaging of the lumbar spine was
performed. No intravenous contrast was administered.

[Series 6: T2 · sagittal · 4.0mm · 0.73mm/px · 4 of 15 slices shown (1 of 2)]
[im 1/15]
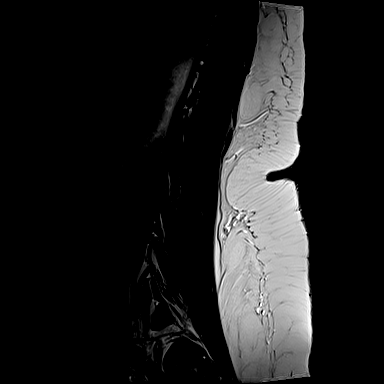
[im 5/15]
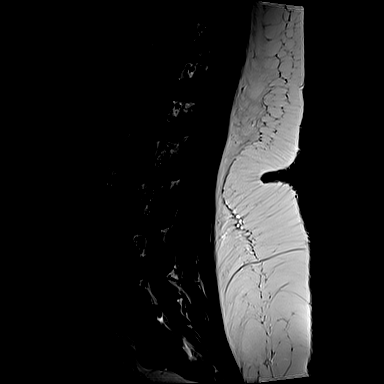
[im 10/15]
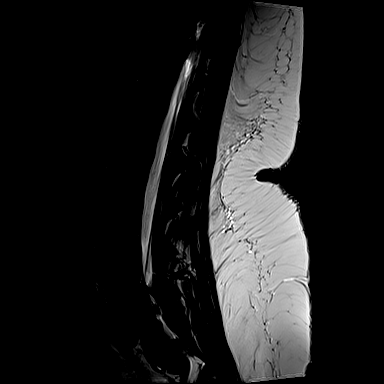
[im 15/15]
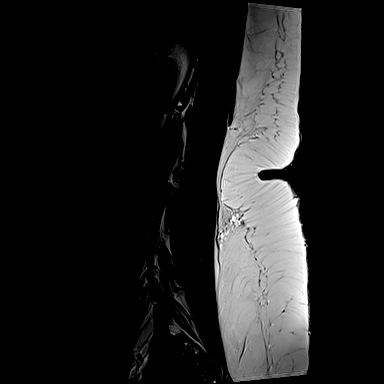

[Series 7: T1 · sagittal · 4.0mm · 0.73mm/px · 3 of 15 slices shown (1 of 2)]
[im 1/15]
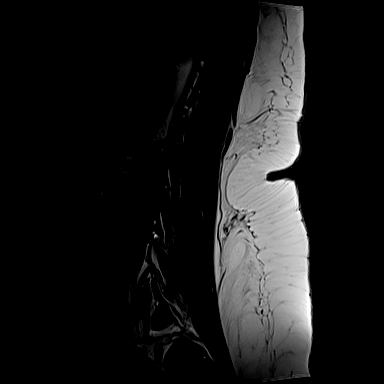
[im 10/15]
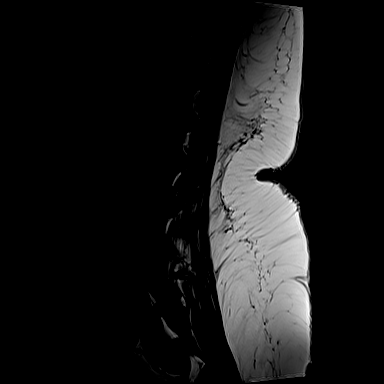
[im 15/15]
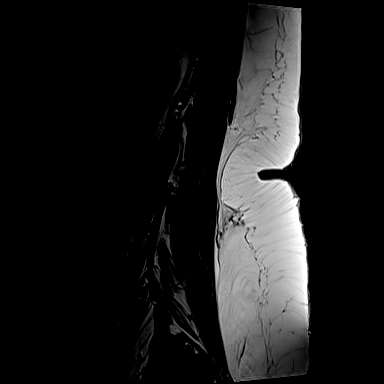

[Series 9: T1 · axial · 4.0mm · 0.28mm/px · z∈[+79,+188]mm · 3 of 12 slices shown (2 of 2)]
[im 1/12]
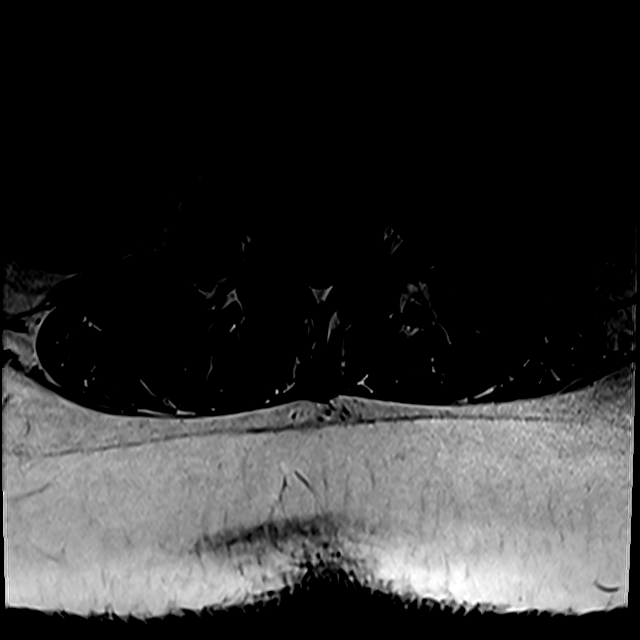
[im 8/12]
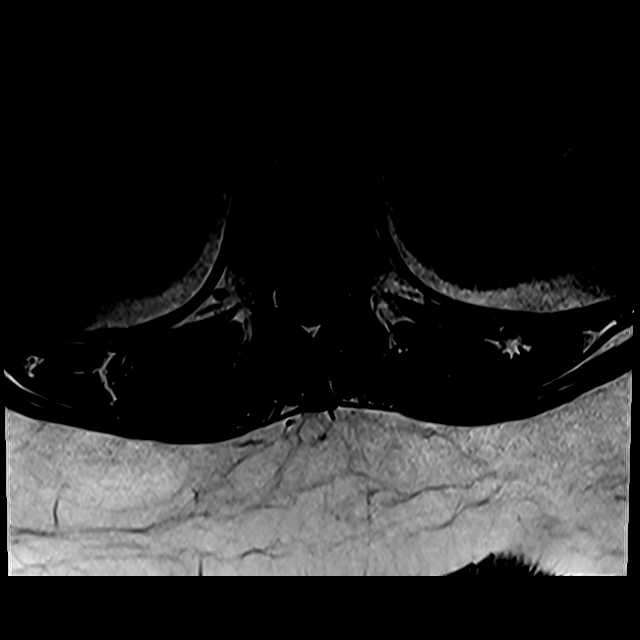
[im 12/12]
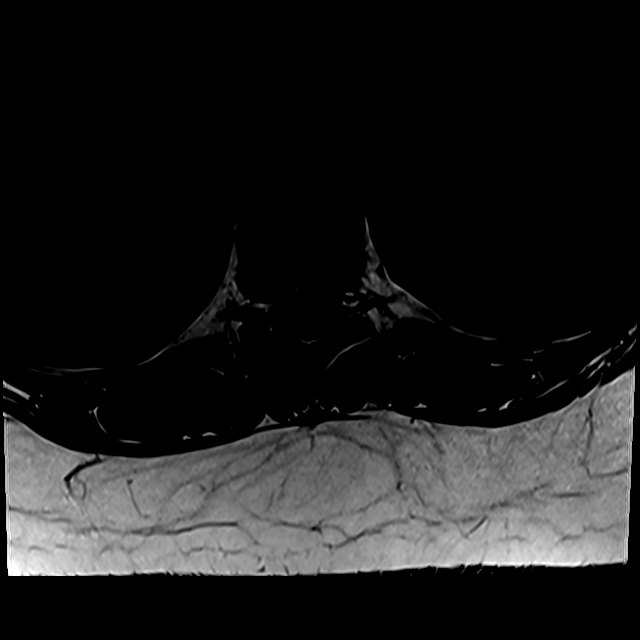

[Series 14: T2 · axial · 4.0mm · 0.28mm/px · z∈[-70,+153]mm · 7 of 52 slices shown (2 of 2)]
[im 4/52]
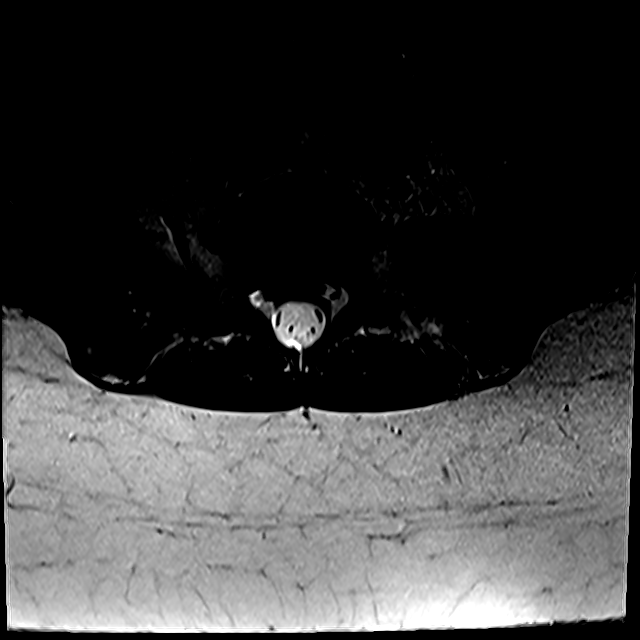
[im 7/52]
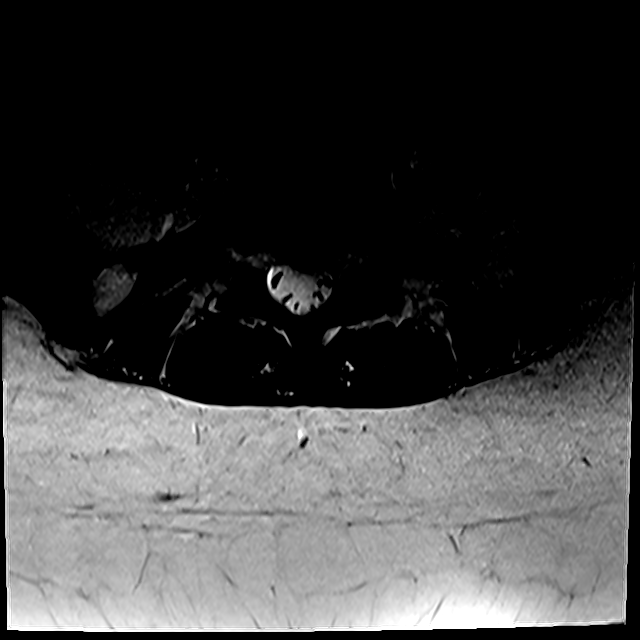
[im 11/52]
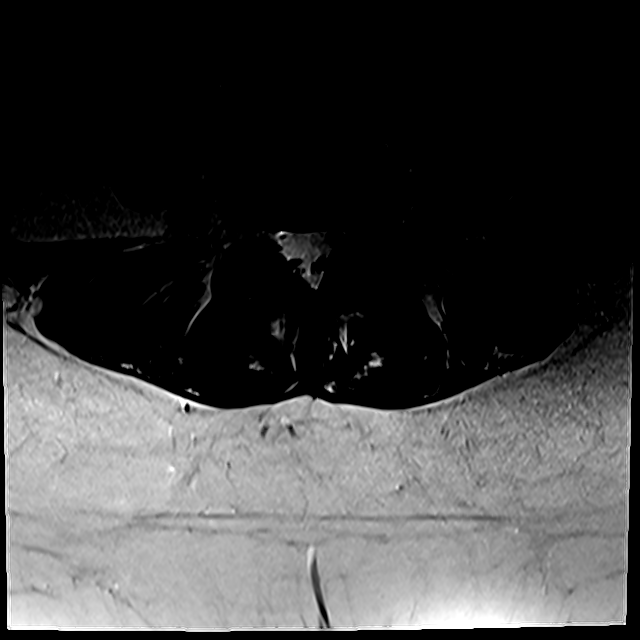
[im 18/52]
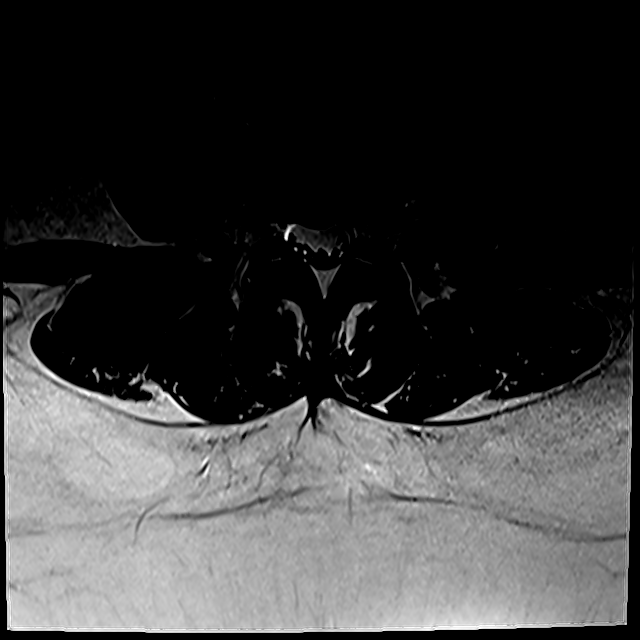
[im 24/52]
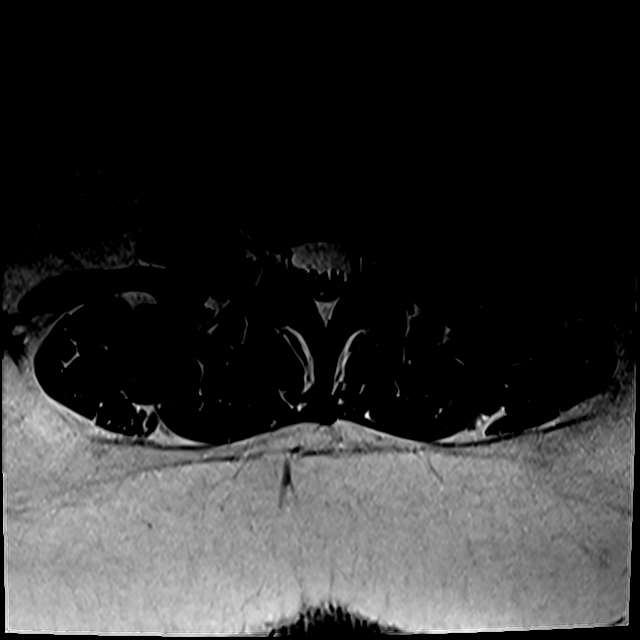
[im 28/52]
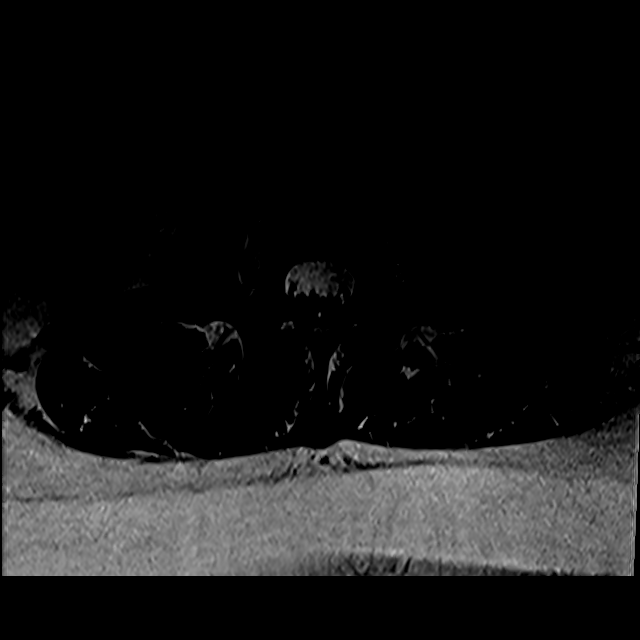
[im 45/52]
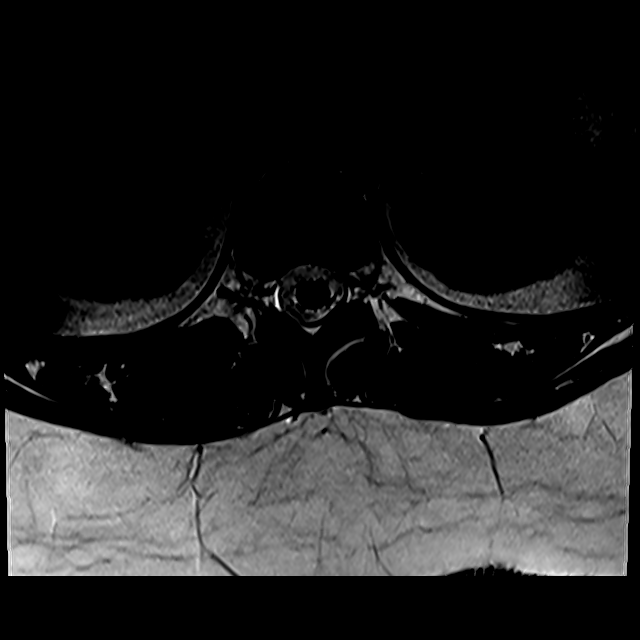

[17 of 48 positions shown; findings below may reference images not displayed]

FINDINGS: Segmentation: Transitional L5 vertebral body which is partially
incorporated in the sacrum. Hypoplastic disc space at L5-S1

Alignment:  Normal

Vertebrae:  Normal bone marrow.  Negative for fracture or mass

Conus medullaris and cauda equina: Conus extends to the mid L1 level
level. Conus and cauda equina appear normal.

Paraspinal and other soft tissues: Negative for paraspinous mass or
adenopathy.

Disc levels:

L1-2: Negative

L2-3: Negative

L3-4: Small central disc protrusion without stenosis. No change from
the prior MRI

L4-5: Mild disc and facet degeneration.  Negative for stenosis

L5-S1: Shallow central and left-sided disc protrusion. This indents
the thecal sac but does not causing nerve root impingement. No
change from the prior MRI.
IMPRESSION: Small central disc protrusion L3-4 unchanged

Shallow left-sided disc protrusion L5-S1 unchanged.

No change from MRI [DATE].

## 2023-05-30 ENCOUNTER — Other Ambulatory Visit: Payer: Self-pay | Admitting: Obstetrics and Gynecology

## 2023-05-30 DIAGNOSIS — Z1231 Encounter for screening mammogram for malignant neoplasm of breast: Secondary | ICD-10-CM

## 2023-06-14 ENCOUNTER — Ambulatory Visit
Admission: RE | Admit: 2023-06-14 | Discharge: 2023-06-14 | Disposition: A | Discharge status: Home / Self Care | Source: Ambulatory Visit | Attending: Obstetrics and Gynecology | Admitting: Obstetrics and Gynecology

## 2023-06-14 DIAGNOSIS — Z1231 Encounter for screening mammogram for malignant neoplasm of breast: Secondary | ICD-10-CM
